# Patient Record
Sex: Male | Born: 1965 | Race: White | Hispanic: No | Marital: Married | Smoking: Never smoker
Health system: Southern US, Community
[De-identification: ages and names within clinical notes are randomized; demographics above are authoritative.]

## PROBLEM LIST (undated history)

## (undated) DIAGNOSIS — M069 Rheumatoid arthritis, unspecified: Secondary | ICD-10-CM

## (undated) DIAGNOSIS — I10 Essential (primary) hypertension: Secondary | ICD-10-CM

## (undated) HISTORY — DX: Essential (primary) hypertension: I10

## (undated) HISTORY — PX: NASAL SINUS SURGERY: SHX719

---

## 2004-08-20 ENCOUNTER — Ambulatory Visit: Payer: Self-pay | Admitting: Family Medicine

## 2009-09-26 ENCOUNTER — Ambulatory Visit: Payer: Self-pay | Admitting: Family Medicine

## 2011-12-02 ENCOUNTER — Ambulatory Visit: Payer: Self-pay | Admitting: Sports Medicine

## 2015-05-18 ENCOUNTER — Other Ambulatory Visit: Payer: Self-pay | Admitting: Family Medicine

## 2015-09-25 ENCOUNTER — Other Ambulatory Visit: Payer: Self-pay | Admitting: Family Medicine

## 2015-10-16 ENCOUNTER — Other Ambulatory Visit: Payer: Self-pay | Admitting: Family Medicine

## 2015-11-22 ENCOUNTER — Ambulatory Visit (INDEPENDENT_AMBULATORY_CARE_PROVIDER_SITE_OTHER): Payer: BLUE CROSS/BLUE SHIELD | Admitting: Family Medicine

## 2015-11-22 ENCOUNTER — Encounter: Payer: Self-pay | Admitting: Family Medicine

## 2015-11-22 VITALS — BP 138/90 | HR 62 | Ht 70.0 in | Wt 218.0 lb

## 2015-11-22 DIAGNOSIS — I10 Essential (primary) hypertension: Secondary | ICD-10-CM | POA: Diagnosis not present

## 2015-11-22 MED ORDER — LISINOPRIL-HYDROCHLOROTHIAZIDE 10-12.5 MG PO TABS: 1.0000 | ORAL_TABLET | Freq: Every day | ORAL | Status: DC

## 2015-11-22 NOTE — Progress Notes (Signed)
Name: Brendan Ray   MRN: [id number]    DOB: [birth year]   Date:11/22/2015       Progress Note  Subjective  Chief Complaint  Chief Complaint  Patient presents with  . Hypertension    Hypertension This is a chronic problem. The current episode started more than 1 year ago. The problem is unchanged. The problem is controlled. Pertinent negatives include no anxiety, blurred vision, chest pain, headaches, malaise/fatigue, neck pain, orthopnea, palpitations, peripheral edema, PND, shortness of breath or sweats. There are no associated agents to hypertension. Risk factors for coronary artery disease include dyslipidemia. Past treatments include ACE inhibitors and diuretics. The current treatment provides mild improvement. There are no compliance problems.  There is no history of angina, kidney disease, CAD/MI, CVA, heart failure, left ventricular hypertrophy, PVD, renovascular disease or retinopathy. There is no history of chronic renal disease or a hypertension causing med.    No problem-specific assessment & plan notes found for this encounter.   Past Medical History  Diagnosis Date  . Hypertension     Past Surgical History  Procedure Laterality Date  . Nasal sinus surgery      History reviewed. No pertinent family history.  Social History   Social History  . Marital Status: Married    Spouse Name: N/A  . Number of Children: N/A  . Years of Education: N/A   Occupational History  . Not on file.   Social History Main Topics  . Smoking status: Never Smoker   . Smokeless tobacco: Not on file  . Alcohol Use: No  . Drug Use: No  . Sexual Activity: Yes   Other Topics Concern  . Not on file   Social History Narrative  . No narrative on file    No Known Allergies   Review of Systems  Constitutional: Negative for fever, chills, weight loss and malaise/fatigue.  HENT: Negative for ear discharge, ear pain and sore throat.   Eyes: Negative for blurred vision.  Respiratory:  Negative for cough, sputum production, shortness of breath and wheezing.   Cardiovascular: Negative for chest pain, palpitations, orthopnea, leg swelling and PND.  Gastrointestinal: Negative for heartburn, nausea, abdominal pain, diarrhea, constipation, blood in stool and melena.  Genitourinary: Negative for dysuria, urgency, frequency and hematuria.  Musculoskeletal: Negative for myalgias, back pain, joint pain and neck pain.  Skin: Negative for rash.  Neurological: Negative for dizziness, tingling, sensory change, focal weakness and headaches.  Endo/Heme/Allergies: Negative for environmental allergies and polydipsia. Does not bruise/bleed easily.  Psychiatric/Behavioral: Negative for depression and suicidal ideas. The patient is not nervous/anxious and does not have insomnia.      Objective  Filed Vitals:   11/22/15 0947  BP: 138/90  Pulse: 62  Height:  (1.778 m)  Weight: 218 lb (98.884 kg)    Physical Exam  Constitutional: He is oriented to person, place, and time and well-developed, well-nourished, and in no distress.  HENT:  Head: Normocephalic.  Right Ear: External ear normal.  Left Ear: External ear normal.  Nose: Nose normal.  Mouth/Throat: Oropharynx is clear and moist.  Eyes: Conjunctivae and EOM are normal. Pupils are equal, round, and reactive to light. Right eye exhibits no discharge. Left eye exhibits no discharge. No scleral icterus.  Neck: Normal range of motion. Neck supple. No JVD present. No tracheal deviation present. No thyromegaly present.  Cardiovascular: Normal rate, regular rhythm, normal heart sounds and intact distal pulses.  Exam reveals no gallop and no friction rub.  No murmur heard. Pulmonary/Chest: Breath sounds normal. No respiratory distress. He has no wheezes. He has no rales.  Abdominal: Soft. Bowel sounds are normal. He exhibits no mass. There is no hepatosplenomegaly. There is no tenderness. There is no rebound, no guarding and no CVA  tenderness.  Musculoskeletal: Normal range of motion. He exhibits no edema or tenderness.  Lymphadenopathy:    He has no cervical adenopathy.  Neurological: He is alert and oriented to person, place, and time. He has normal sensation, normal strength, normal reflexes and intact cranial nerves. No cranial nerve deficit.  Skin: Skin is warm. No rash noted.  Psychiatric: Mood and affect normal.  Nursing note and vitals reviewed.     Assessment & Plan  Problem List Items Addressed This Visit      Cardiovascular and Mediastinum   Essential hypertension - Primary   Relevant Medications   lisinopril-hydrochlorothiazide (PRINZIDE,ZESTORETIC) 10-12.5 MG tablet   Other Relevant Orders   Renal Function Panel        Dr. Elizabeth Sauereanna Axtyn Woehler [facility]  11/22/2015

## 2015-11-23 LAB — RENAL FUNCTION PANEL
ALBUMIN: 4 g/dL (ref 3.5–5.5)
BUN/Creatinine Ratio: 9 (ref 9–20)
BUN: 10 mg/dL (ref 6–24)
CALCIUM: 9.2 mg/dL (ref 8.7–10.2)
CHLORIDE: 101 mmol/L (ref 96–106)
CO2: 22 mmol/L (ref 18–29)
Creatinine, Ser: 1.09 mg/dL (ref 0.76–1.27)
GFR calc Af Amer: 91 mL/min/{1.73_m2} (ref >59)
GFR, EST NON AFRICAN AMERICAN: 79 mL/min/{1.73_m2} (ref >59)
GLUCOSE: 91 mg/dL (ref 65–99)
PHOSPHORUS: 2.8 mg/dL (ref 2.5–4.5)
POTASSIUM: 4.3 mmol/L (ref 3.5–5.2)
Sodium: 140 mmol/L (ref 134–144)

## 2016-04-16 ENCOUNTER — Encounter: Payer: Self-pay | Admitting: Family Medicine

## 2016-04-16 ENCOUNTER — Ambulatory Visit
Admission: RE | Admit: 2016-04-16 | Discharge: 2016-04-16 | Disposition: A | Discharge status: Home / Self Care | Payer: BLUE CROSS/BLUE SHIELD | Source: Ambulatory Visit | Attending: Family Medicine | Admitting: Family Medicine

## 2016-04-16 ENCOUNTER — Ambulatory Visit (INDEPENDENT_AMBULATORY_CARE_PROVIDER_SITE_OTHER): Payer: BLUE CROSS/BLUE SHIELD | Admitting: Family Medicine

## 2016-04-16 VITALS — BP 120/80 | HR 68 | Ht 70.0 in | Wt 216.0 lb

## 2016-04-16 DIAGNOSIS — M519 Unspecified thoracic, thoracolumbar and lumbosacral intervertebral disc disorder: Secondary | ICD-10-CM

## 2016-04-16 DIAGNOSIS — M47814 Spondylosis without myelopathy or radiculopathy, thoracic region: Secondary | ICD-10-CM | POA: Diagnosis not present

## 2016-04-16 DIAGNOSIS — M47812 Spondylosis without myelopathy or radiculopathy, cervical region: Secondary | ICD-10-CM | POA: Insufficient documentation

## 2016-04-16 MED ORDER — MELOXICAM 15 MG PO TABS: 15.0000 mg | ORAL_TABLET | Freq: Every day | ORAL | 2 refills | Status: DC

## 2016-04-16 MED ORDER — PREDNISONE 10 MG PO TABS: 10.0000 mg | ORAL_TABLET | Freq: Every day | ORAL | 0 refills | Status: DC

## 2016-04-16 MED ORDER — TRAMADOL HCL 50 MG PO TABS: 50.0000 mg | ORAL_TABLET | Freq: Every evening | ORAL | 0 refills | Status: DC | PRN

## 2016-04-16 NOTE — Progress Notes (Signed)
Name: Brendan Ray   MRN: [id number]    DOB: 10-08-1965   Date:04/16/2016       Progress Note  Subjective  Chief Complaint  Chief Complaint  Patient presents with  . Back Pain    has had back issues x 3 years- when he twists or moves the wrong way it will flare up again    Back Pain  This is a recurrent problem. The current episode started more than 1 month ago. The problem occurs constantly. The problem has been waxing and waning since onset. The pain is present in the thoracic spine and lumbar spine. The quality of the pain is described as aching. The pain does not radiate. The pain is at a severity of 3/10. The pain is moderate. The symptoms are aggravated by twisting and sitting. Pertinent negatives include no abdominal pain, bladder incontinence, bowel incontinence, chest pain, dysuria, fever, headaches, leg pain, numbness, paresis, paresthesias, tingling, weakness or weight loss. He has tried NSAIDs for the symptoms. The treatment provided moderate relief.    No problem-specific Assessment & Plan notes found for this encounter.   Past Medical History:  Diagnosis Date  . Hypertension     Past Surgical History:  Procedure Laterality Date  . NASAL SINUS SURGERY      History reviewed. No pertinent family history.  Social History   Social History  . Marital status: Married    Spouse name: N/A  . Number of children: N/A  . Years of education: N/A   Occupational History  . Not on file.   Social History Main Topics  . Smoking status: Never Smoker  . Smokeless tobacco: Not on file  . Alcohol use No  . Drug use: No  . Sexual activity: Yes   Other Topics Concern  . Not on file   Social History Narrative  . No narrative on file    No Known Allergies   Review of Systems  Constitutional: Negative for chills, fever, malaise/fatigue and weight loss.  HENT: Negative for ear discharge, ear pain and sore throat.   Eyes: Negative for blurred vision.  Respiratory:  Negative for cough, sputum production, shortness of breath and wheezing.   Cardiovascular: Negative for chest pain, palpitations and leg swelling.  Gastrointestinal: Negative for abdominal pain, blood in stool, bowel incontinence, constipation, diarrhea, heartburn, melena and nausea.  Genitourinary: Negative for bladder incontinence, dysuria, frequency, hematuria and urgency.  Musculoskeletal: Positive for back pain. Negative for joint pain, myalgias and neck pain.  Skin: Negative for rash.  Neurological: Negative for dizziness, tingling, sensory change, focal weakness, weakness, numbness, headaches and paresthesias.  Endo/Heme/Allergies: Negative for environmental allergies and polydipsia. Does not bruise/bleed easily.  Psychiatric/Behavioral: Negative for depression and suicidal ideas. The patient is not nervous/anxious and does not have insomnia.      Objective  Vitals:   04/16/16 1113  BP: 120/80  Pulse: 68  Weight: 216 lb (98 kg)  Height: 5\' 10"  (1.778 m)    Physical Exam  Constitutional: He is oriented to person, place, and time and well-developed, well-nourished, and in no distress.  HENT:  Head: Normocephalic.  Right Ear: External ear normal.  Left Ear: External ear normal.  Nose: Nose normal.  Mouth/Throat: Oropharynx is clear and moist.  Eyes: Conjunctivae and EOM are normal. Pupils are equal, round, and reactive to light. Right eye exhibits no discharge. Left eye exhibits no discharge. No scleral icterus.  Neck: Normal range of motion. Neck supple. No JVD present. No tracheal deviation  present. No thyromegaly present.  Cardiovascular: Normal rate, regular rhythm, normal heart sounds and intact distal pulses.  Exam reveals no gallop and no friction rub.   No murmur heard. Pulmonary/Chest: Breath sounds normal. No respiratory distress. He has no wheezes. He has no rales.  Abdominal: Soft. Bowel sounds are normal. He exhibits no mass. There is no hepatosplenomegaly. There  is no tenderness. There is no rebound, no guarding and no CVA tenderness.  Musculoskeletal: Normal range of motion. He exhibits no edema.       Thoracic back: He exhibits tenderness. He exhibits no deformity.  Right 9th 10th costovertebral/rib  Lymphadenopathy:    He has no cervical adenopathy.  Neurological: He is alert and oriented to person, place, and time. He has normal sensation, normal strength, normal reflexes and intact cranial nerves. No cranial nerve deficit.  Skin: Skin is warm. No rash noted.  Psychiatric: Mood and affect normal.  Nursing note and vitals reviewed.     Assessment & Plan  Problem List Items Addressed This Visit    None    Visit Diagnoses    Thoracic disc disease    -  Primary   Relevant Medications   predniSONE (DELTASONE) 10 MG tablet   traMADol (ULTRAM) 50 MG tablet   meloxicam (MOBIC) 15 MG tablet   Other Relevant Orders   DG Thoracic Spine W/Swimmers        Dr. Hayden Rasmusseneanna Jones [facility]  04/16/16

## 2016-04-25 ENCOUNTER — Other Ambulatory Visit: Payer: Self-pay

## 2016-04-25 MED ORDER — CYCLOBENZAPRINE HCL 10 MG PO TABS: 10.0000 mg | ORAL_TABLET | Freq: Three times a day (TID) | ORAL | 0 refills | Status: DC | PRN

## 2016-05-22 ENCOUNTER — Other Ambulatory Visit: Payer: Self-pay | Admitting: Family Medicine

## 2016-05-22 DIAGNOSIS — I10 Essential (primary) hypertension: Secondary | ICD-10-CM

## 2016-10-01 ENCOUNTER — Other Ambulatory Visit: Payer: Self-pay | Admitting: Family Medicine

## 2016-10-01 ENCOUNTER — Ambulatory Visit
Admission: RE | Admit: 2016-10-01 | Discharge: 2016-10-01 | Disposition: A | Discharge status: Home / Self Care | Payer: BLUE CROSS/BLUE SHIELD | Source: Ambulatory Visit | Attending: Family Medicine | Admitting: Family Medicine

## 2016-10-01 DIAGNOSIS — R059 Cough, unspecified: Secondary | ICD-10-CM

## 2016-10-01 DIAGNOSIS — R509 Fever, unspecified: Secondary | ICD-10-CM

## 2016-10-01 DIAGNOSIS — R062 Wheezing: Secondary | ICD-10-CM | POA: Diagnosis not present

## 2016-10-01 DIAGNOSIS — R05 Cough: Secondary | ICD-10-CM

## 2016-10-01 DIAGNOSIS — R0602 Shortness of breath: Secondary | ICD-10-CM | POA: Diagnosis not present

## 2016-10-01 DIAGNOSIS — R079 Chest pain, unspecified: Secondary | ICD-10-CM | POA: Diagnosis not present

## 2016-10-20 ENCOUNTER — Other Ambulatory Visit: Payer: Self-pay | Admitting: Family Medicine

## 2016-10-20 DIAGNOSIS — I10 Essential (primary) hypertension: Secondary | ICD-10-CM

## 2016-11-17 ENCOUNTER — Other Ambulatory Visit: Payer: Self-pay

## 2017-01-28 ENCOUNTER — Ambulatory Visit: Payer: BLUE CROSS/BLUE SHIELD | Admitting: Family Medicine

## 2017-01-29 ENCOUNTER — Encounter: Payer: Self-pay | Admitting: Family Medicine

## 2017-01-29 ENCOUNTER — Ambulatory Visit (INDEPENDENT_AMBULATORY_CARE_PROVIDER_SITE_OTHER): Payer: BLUE CROSS/BLUE SHIELD | Admitting: Family Medicine

## 2017-01-29 VITALS — BP 140/80 | HR 84 | Ht 70.0 in | Wt 224.0 lb

## 2017-01-29 DIAGNOSIS — M545 Low back pain: Secondary | ICD-10-CM | POA: Diagnosis not present

## 2017-01-29 DIAGNOSIS — G8929 Other chronic pain: Secondary | ICD-10-CM | POA: Diagnosis not present

## 2017-01-29 DIAGNOSIS — I10 Essential (primary) hypertension: Secondary | ICD-10-CM

## 2017-01-29 DIAGNOSIS — Z1211 Encounter for screening for malignant neoplasm of colon: Secondary | ICD-10-CM

## 2017-01-29 DIAGNOSIS — M25561 Pain in right knee: Secondary | ICD-10-CM

## 2017-01-29 DIAGNOSIS — E663 Overweight: Secondary | ICD-10-CM

## 2017-01-29 DIAGNOSIS — M519 Unspecified thoracic, thoracolumbar and lumbosacral intervertebral disc disorder: Secondary | ICD-10-CM | POA: Diagnosis not present

## 2017-01-29 DIAGNOSIS — M25562 Pain in left knee: Secondary | ICD-10-CM

## 2017-01-29 DIAGNOSIS — Z23 Encounter for immunization: Secondary | ICD-10-CM

## 2017-01-29 LAB — POCT URINALYSIS DIPSTICK
Bilirubin, UA: NEGATIVE
Blood, UA: NEGATIVE
Glucose, UA: NEGATIVE
KETONES UA: NEGATIVE
LEUKOCYTES UA: NEGATIVE
NITRITE UA: NEGATIVE
PROTEIN UA: NEGATIVE
Spec Grav, UA: 1.01 (ref 1.010–1.025)
Urobilinogen, UA: 0.2 E.U./dL
pH, UA: 7 (ref 5.0–8.0)

## 2017-01-29 LAB — HEMOCCULT GUIAC POC 1CARD (OFFICE): Fecal Occult Blood, POC: NEGATIVE

## 2017-01-29 MED ORDER — LISINOPRIL-HYDROCHLOROTHIAZIDE 10-12.5 MG PO TABS: 1.0000 | ORAL_TABLET | Freq: Every day | ORAL | 3 refills | Status: DC

## 2017-01-29 MED ORDER — CYCLOBENZAPRINE HCL 10 MG PO TABS: 10.0000 mg | ORAL_TABLET | Freq: Three times a day (TID) | ORAL | 6 refills | Status: DC | PRN

## 2017-01-29 MED ORDER — MELOXICAM 15 MG PO TABS: 15.0000 mg | ORAL_TABLET | Freq: Every day | ORAL | 6 refills | Status: DC

## 2017-01-29 NOTE — Progress Notes (Signed)
Name: Brendan Ray   MRN: [id number]    DOB: Oct 07, 1965   Date:01/29/2017       Progress Note  Subjective  Chief Complaint  Chief Complaint  Patient presents with  . Hypertension  . Back Pain    refill flexeril and meloxicam    Hypertension  This is a chronic problem. The current episode started more than 1 year ago. The problem is unchanged. The problem is controlled. Pertinent negatives include no anxiety, blurred vision, chest pain, headaches, malaise/fatigue, neck pain, orthopnea, palpitations, peripheral edema, PND, shortness of breath or sweats. There are no associated agents to hypertension. There are no known risk factors for coronary artery disease. Past treatments include ACE inhibitors and diuretics. The current treatment provides moderate improvement. There are no compliance problems.  There is no history of angina, kidney disease, CAD/MI, CVA, heart failure, left ventricular hypertrophy, PVD or retinopathy. There is no history of chronic renal disease, a hypertension causing med or renovascular disease.  Back Pain  This is a chronic problem. The current episode started more than 1 year ago. The problem occurs intermittently. The problem has been waxing and waning since onset. The pain is present in the lumbar spine. The pain is at a severity of 4/10. The symptoms are aggravated by twisting. Pertinent negatives include no abdominal pain, bladder incontinence, bowel incontinence, chest pain, dysuria, fever, headaches, numbness, tingling, weakness or weight loss.  Knee Pain   The incident occurred more than 1 week ago. There was no injury mechanism. The pain is mild. Pertinent negatives include no numbness or tingling.    No problem-specific Assessment & Plan notes found for this encounter.   Past Medical History:  Diagnosis Date  . Hypertension     Past Surgical History:  Procedure Laterality Date  . NASAL SINUS SURGERY      No family history on file.  Social History    Social History  . Marital status: Married    Spouse name: N/A  . Number of children: N/A  . Years of education: N/A   Occupational History  . Not on file.   Social History Main Topics  . Smoking status: Never Smoker  . Smokeless tobacco: Never Used  . Alcohol use No  . Drug use: No  . Sexual activity: Yes   Other Topics Concern  . Not on file   Social History Narrative  . No narrative on file    No Known Allergies  Outpatient Medications Prior to Visit  Medication Sig Dispense Refill  . cyclobenzaprine (FLEXERIL) 10 MG tablet Take 1 tablet (10 mg total) by mouth 3 (three) times daily as needed for muscle spasms. 30 tablet 0  . lisinopril-hydrochlorothiazide (PRINZIDE,ZESTORETIC) 10-12.5 MG tablet TAKE 1 TABLET BY MOUTH DAILY. 30 tablet 0  . meloxicam (MOBIC) 15 MG tablet Take 1 tablet (15 mg total) by mouth daily. (Patient not taking: Reported on 01/29/2017) 30 tablet 2  . predniSONE (DELTASONE) 10 MG tablet Take 1 tablet (10 mg total) by mouth daily with breakfast. 14 tablet 0  . traMADol (ULTRAM) 50 MG tablet Take 1 tablet (50 mg total) by mouth at bedtime as needed. 30 tablet 0   No facility-administered medications prior to visit.     Review of Systems  Constitutional: Negative for chills, fever, malaise/fatigue and weight loss.  HENT: Negative for ear discharge, ear pain and sore throat.   Eyes: Negative for blurred vision.  Respiratory: Negative for cough, sputum production, shortness of breath and wheezing.  Cardiovascular: Negative for chest pain, palpitations, orthopnea, leg swelling and PND.  Gastrointestinal: Negative for abdominal pain, blood in stool, bowel incontinence, constipation, diarrhea, heartburn, melena and nausea.  Genitourinary: Negative for bladder incontinence, dysuria, frequency, hematuria and urgency.  Musculoskeletal: Positive for back pain and joint pain. Negative for myalgias and neck pain.  Skin: Negative for rash.  Neurological:  Negative for dizziness, tingling, sensory change, focal weakness, weakness, numbness and headaches.  Endo/Heme/Allergies: Negative for environmental allergies and polydipsia. Does not bruise/bleed easily.  Psychiatric/Behavioral: Negative for depression and suicidal ideas. The patient is not nervous/anxious and does not have insomnia.      Objective  Vitals:   01/29/17 0928  BP: 140/80  Pulse: 84  Weight: 224 lb (101.6 kg)  Height: 5\' 10"  (1.778 m)    Physical Exam  Constitutional: He is oriented to person, place, and time and well-developed, well-nourished, and in no distress.  HENT:  Head: Normocephalic.  Right Ear: External ear normal.  Left Ear: External ear normal.  Nose: Nose normal.  Mouth/Throat: Oropharynx is clear and moist.  Eyes: Pupils are equal, round, and reactive to light. Conjunctivae and EOM are normal. Right eye exhibits no discharge. Left eye exhibits no discharge. No scleral icterus.  Neck: Normal range of motion. Neck supple. No JVD present. No tracheal deviation present. No thyromegaly present.  Cardiovascular: Normal rate, regular rhythm, normal heart sounds and intact distal pulses.  Exam reveals no gallop and no friction rub.   No murmur heard. Pulmonary/Chest: Breath sounds normal. No respiratory distress. He has no wheezes. He has no rales.  Abdominal: Soft. Bowel sounds are normal. He exhibits no mass. There is no hepatosplenomegaly. There is no tenderness. There is no rebound, no guarding and no CVA tenderness.  Genitourinary: Rectum normal, prostate normal and testes/scrotum normal. Rectal exam shows guaiac negative stool.  Musculoskeletal: Normal range of motion. He exhibits no edema or tenderness.  Lymphadenopathy:    He has no cervical adenopathy.  Neurological: He is alert and oriented to person, place, and time. He has normal sensation, normal strength, normal reflexes and intact cranial nerves. No cranial nerve deficit.  Skin: Skin is warm. No  rash noted.  Psychiatric: Mood and affect normal.  Nursing note and vitals reviewed.     Assessment & Plan  Problem List Items Addressed This Visit      Cardiovascular and Mediastinum   Essential hypertension - Primary   Relevant Medications   lisinopril-hydrochlorothiazide (PRINZIDE,ZESTORETIC) 10-12.5 MG tablet   Other Relevant Orders   Renal Function Panel    Other Visit Diagnoses    Chronic bilateral low back pain without sciatica       Relevant Medications   meloxicam (MOBIC) 15 MG tablet   cyclobenzaprine (FLEXERIL) 10 MG tablet   Arthralgia of both knees       Thoracic disc disease       Relevant Medications   meloxicam (MOBIC) 15 MG tablet   Colon cancer screening       Relevant Orders   Ambulatory referral to Gastroenterology   POCT occult blood stool (Completed)   Need for diphtheria-tetanus-pertussis (Tdap) vaccine       Overweight       Relevant Orders   Lipid Profile   POCT urinalysis dipstick (Completed)      Meds ordered this encounter  Medications  . meloxicam (MOBIC) 15 MG tablet    Sig: Take 1 tablet (15 mg total) by mouth daily.    Dispense:  30 tablet  Refill:  6  . lisinopril-hydrochlorothiazide (PRINZIDE,ZESTORETIC) 10-12.5 MG tablet    Sig: Take 1 tablet by mouth daily.    Dispense:  90 tablet    Refill:  3    Needs appt  . cyclobenzaprine (FLEXERIL) 10 MG tablet    Sig: Take 1 tablet (10 mg total) by mouth 3 (three) times daily as needed for muscle spasms.    Dispense:  30 tablet    Refill:  6      Dr. Elizabeth Sauer [facility]  01/29/17

## 2017-01-30 LAB — LIPID PANEL
CHOLESTEROL TOTAL: 173 mg/dL (ref 100–199)
Chol/HDL Ratio: 4.6 ratio (ref 0.0–5.0)
HDL: 38 mg/dL — ABNORMAL LOW (ref >39)
LDL CALC: 114 mg/dL — AB (ref 0–99)
Triglycerides: 105 mg/dL (ref 0–149)
VLDL Cholesterol Cal: 21 mg/dL (ref 5–40)

## 2017-01-30 LAB — RENAL FUNCTION PANEL
Albumin: 4.1 g/dL (ref 3.5–5.5)
BUN / CREAT RATIO: 9 (ref 9–20)
BUN: 10 mg/dL (ref 6–24)
CO2: 20 mmol/L (ref 20–29)
CREATININE: 1.11 mg/dL (ref 0.76–1.27)
Calcium: 8.8 mg/dL (ref 8.7–10.2)
Chloride: 104 mmol/L (ref 96–106)
GFR calc Af Amer: 88 mL/min/{1.73_m2} (ref >59)
GFR calc non Af Amer: 76 mL/min/{1.73_m2} (ref >59)
GLUCOSE: 84 mg/dL (ref 65–99)
POTASSIUM: 4 mmol/L (ref 3.5–5.2)
Phosphorus: 2.2 mg/dL — ABNORMAL LOW (ref 2.5–4.5)
SODIUM: 141 mmol/L (ref 134–144)

## 2017-02-13 ENCOUNTER — Telehealth: Payer: Self-pay

## 2017-03-06 NOTE — Telephone Encounter (Signed)
ERROR

## 2017-04-10 ENCOUNTER — Ambulatory Visit
Admission: RE | Admit: 2017-04-10 | Discharge: 2017-04-10 | Disposition: A | Discharge status: Home / Self Care | Payer: BLUE CROSS/BLUE SHIELD | Source: Ambulatory Visit | Attending: Family Medicine | Admitting: Family Medicine

## 2017-04-10 ENCOUNTER — Ambulatory Visit (INDEPENDENT_AMBULATORY_CARE_PROVIDER_SITE_OTHER): Payer: BLUE CROSS/BLUE SHIELD | Admitting: Family Medicine

## 2017-04-10 ENCOUNTER — Other Ambulatory Visit: Payer: Self-pay | Admitting: Family Medicine

## 2017-04-10 ENCOUNTER — Encounter: Payer: Self-pay | Admitting: Family Medicine

## 2017-04-10 VITALS — BP 130/80 | HR 80 | Ht 70.0 in | Wt 225.0 lb

## 2017-04-10 DIAGNOSIS — M546 Pain in thoracic spine: Secondary | ICD-10-CM

## 2017-04-10 DIAGNOSIS — M519 Unspecified thoracic, thoracolumbar and lumbosacral intervertebral disc disorder: Secondary | ICD-10-CM

## 2017-04-10 DIAGNOSIS — M5134 Other intervertebral disc degeneration, thoracic region: Secondary | ICD-10-CM | POA: Diagnosis not present

## 2017-04-10 DIAGNOSIS — M545 Low back pain, unspecified: Secondary | ICD-10-CM

## 2017-04-10 DIAGNOSIS — M47814 Spondylosis without myelopathy or radiculopathy, thoracic region: Secondary | ICD-10-CM | POA: Diagnosis not present

## 2017-04-10 DIAGNOSIS — R0789 Other chest pain: Secondary | ICD-10-CM | POA: Diagnosis not present

## 2017-04-10 DIAGNOSIS — G8929 Other chronic pain: Secondary | ICD-10-CM

## 2017-04-10 DIAGNOSIS — M5136 Other intervertebral disc degeneration, lumbar region: Secondary | ICD-10-CM | POA: Diagnosis not present

## 2017-04-10 DIAGNOSIS — R0781 Pleurodynia: Secondary | ICD-10-CM | POA: Diagnosis not present

## 2017-04-10 MED ORDER — CYCLOBENZAPRINE HCL 10 MG PO TABS: 10.0000 mg | ORAL_TABLET | Freq: Three times a day (TID) | ORAL | 6 refills | Status: DC | PRN

## 2017-04-10 MED ORDER — MELOXICAM 15 MG PO TABS: 15.0000 mg | ORAL_TABLET | Freq: Every day | ORAL | 6 refills | Status: DC
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
## 2017-04-13 ENCOUNTER — Other Ambulatory Visit: Payer: Self-pay

## 2017-04-13 DIAGNOSIS — M545 Low back pain, unspecified: Secondary | ICD-10-CM

## 2018-02-10 ENCOUNTER — Other Ambulatory Visit: Payer: Self-pay | Admitting: Family Medicine

## 2018-02-10 DIAGNOSIS — I10 Essential (primary) hypertension: Secondary | ICD-10-CM

## 2018-02-23 ENCOUNTER — Other Ambulatory Visit: Payer: Self-pay | Admitting: Family Medicine

## 2018-02-23 DIAGNOSIS — I10 Essential (primary) hypertension: Secondary | ICD-10-CM

## 2018-06-09 ENCOUNTER — Telehealth: Payer: Self-pay

## 2018-06-09 NOTE — Telephone Encounter (Signed)
Pt called in stating that he has lost 11 pounds on a diet and is having B/P readings ranging from 90s/50s- 118/80. Wanted to know could he stop or decrease b/p med. Pt was told he could stop the b/p med, but we needed to check his b/p in 4 weeks to make sure it has not increased again. Appt made for 07/06/18 @ 10:20- pt agreed

## 2018-07-06 ENCOUNTER — Encounter: Payer: Self-pay | Admitting: Family Medicine

## 2018-07-06 ENCOUNTER — Ambulatory Visit (INDEPENDENT_AMBULATORY_CARE_PROVIDER_SITE_OTHER): Payer: BLUE CROSS/BLUE SHIELD | Admitting: Family Medicine

## 2018-07-06 VITALS — BP 120/70 | HR 78 | Ht 70.0 in | Wt 208.0 lb

## 2018-07-06 DIAGNOSIS — E663 Overweight: Secondary | ICD-10-CM | POA: Diagnosis not present

## 2018-07-06 DIAGNOSIS — Z23 Encounter for immunization: Secondary | ICD-10-CM

## 2018-07-06 NOTE — Progress Notes (Signed)
Date:  07/06/2018   Name:  Brendan Ray   DOB:  Oct 20, 1965   MRN:  [id number]   Chief Complaint: Hypertension (stopped B/P med due to weight loss- recheck b/p) and tdap  Hypertension  This is a new problem. The current episode started more than 1 year ago. The problem is unchanged. The problem is controlled. Pertinent negatives include no anxiety, blurred vision, chest pain, headaches, malaise/fatigue, neck pain, orthopnea, palpitations, peripheral edema, PND, shortness of breath or sweats. There are no associated agents to hypertension. There are no known risk factors for coronary artery disease. Past treatments include lifestyle changes. The current treatment provides mild improvement. There are no compliance problems.  There is no history of angina, kidney disease, CAD/MI, CVA, heart failure, left ventricular hypertrophy, PVD or retinopathy. There is no history of chronic renal disease, a hypertension causing med or renovascular disease.    Review of Systems  Constitutional: Negative for chills, fever and malaise/fatigue.  HENT: Negative for drooling, ear discharge, ear pain and sore throat.   Eyes: Negative for blurred vision.  Respiratory: Negative for cough, shortness of breath and wheezing.   Cardiovascular: Negative for chest pain, palpitations, orthopnea, leg swelling and PND.  Gastrointestinal: Negative for abdominal pain, blood in stool, constipation, diarrhea and nausea.  Endocrine: Negative for polydipsia.  Genitourinary: Negative for dysuria, frequency, hematuria and urgency.  Musculoskeletal: Negative for back pain, myalgias and neck pain.  Skin: Negative for rash.  Allergic/Immunologic: Negative for environmental allergies.  Neurological: Negative for dizziness and headaches.  Hematological: Does not bruise/bleed easily.  Psychiatric/Behavioral: Negative for suicidal ideas. The patient is not nervous/anxious.     Patient Active Problem List   Diagnosis Date Noted  .  Essential hypertension 11/22/2015    No Known Allergies  Past Surgical History:  Procedure Laterality Date  . NASAL SINUS SURGERY      Social History   Tobacco Use  . Smoking status: Never Smoker  . Smokeless tobacco: Never Used  Substance Use Topics  . Alcohol use: No    Alcohol/week: 0.0 standard drinks  . Drug use: No     Medication list has been reviewed and updated.  Current Meds  Medication Sig  . [DISCONTINUED] cyclobenzaprine (FLEXERIL) 10 MG tablet Take 1 tablet (10 mg total) by mouth 3 (three) times daily as needed for muscle spasms.    PHQ 2/9 Scores 07/06/2018 11/22/2015  PHQ - 2 Score 0 0  PHQ- 9 Score 0 -    Physical Exam Vitals signs and nursing note reviewed.  Constitutional:      Appearance: He is well-developed.  HENT:     Head: Normocephalic.     Right Ear: External ear normal.     Left Ear: External ear normal.     Nose: Nose normal.  Eyes:     Conjunctiva/sclera: Conjunctivae normal.     Pupils: Pupils are equal, round, and reactive to light.  Neck:     Musculoskeletal: Normal range of motion and neck supple.  Cardiovascular:     Rate and Rhythm: Normal rate and regular rhythm.     Heart sounds: Normal heart sounds. No murmur. No gallop.   Pulmonary:     Effort: Pulmonary effort is normal.     Breath sounds: Normal breath sounds.  Abdominal:     General: Bowel sounds are normal.     Palpations: Abdomen is soft.  Genitourinary:    Penis: Normal.      Prostate: Normal.  Rectum: Normal.  Musculoskeletal: Normal range of motion.  Skin:    General: Skin is warm and dry.  Neurological:     Mental Status: He is alert and oriented to person, place, and time.     Deep Tendon Reflexes: Reflexes are normal and symmetric.  Psychiatric:        Behavior: Behavior normal.        Thought Content: Thought content normal.        Judgment: Judgment normal.     BP 120/70   Pulse 78   Ht 5\' 10"  (1.778 m)   Wt 208 lb (94.3 kg)   BMI 29.84  kg/m   Assessment and Plan: 1. Overweight (BMI 25.0-29.9) Since weight loss by design diet patient has lost over 20 pounds.  Pressure medication was discontinued later this month patient returns for blood pressure reading.  Pressure doing well today.  It was further encouraged to lose 10 pounds to the 190 range.  2. Need for Tdap vaccination Discussed and administered. - Tdap vaccine greater than or equal to 7yo IM

## 2018-07-16 ENCOUNTER — Encounter: Payer: Self-pay | Admitting: Emergency Medicine

## 2018-07-16 ENCOUNTER — Other Ambulatory Visit: Payer: Self-pay

## 2018-07-16 ENCOUNTER — Ambulatory Visit
Admission: EM | Admit: 2018-07-16 | Discharge: 2018-07-16 | Disposition: A | Discharge status: Home / Self Care | Payer: BLUE CROSS/BLUE SHIELD | Attending: Family Medicine | Admitting: Family Medicine

## 2018-07-16 DIAGNOSIS — J0100 Acute maxillary sinusitis, unspecified: Secondary | ICD-10-CM | POA: Diagnosis not present

## 2018-07-16 MED ORDER — METHYLPREDNISOLONE 4 MG PO TBPK: ORAL_TABLET | ORAL | 0 refills | Status: DC

## 2018-07-16 MED ORDER — PSEUDOEPHEDRINE HCL ER 120 MG PO TB12: 120.0000 mg | ORAL_TABLET | Freq: Two times a day (BID) | ORAL | 0 refills | Status: AC

## 2018-07-16 MED ORDER — AMOXICILLIN-POT CLAVULANATE 875-125 MG PO TABS: 1.0000 | ORAL_TABLET | Freq: Two times a day (BID) | ORAL | 0 refills | Status: DC

## 2018-07-16 NOTE — ED Triage Notes (Signed)
Patient c/o nasal congestion and pressure that started 3 weeks. Patient has taken OTC Sudafed for his symptoms.

## 2018-07-16 NOTE — ED Provider Notes (Signed)
[facility]    CSN: [id number] Arrival date & time: 07/16/18  1011     History   Chief Complaint Chief Complaint  Patient presents with  . Nasal Congestion  . Facial Pain    HPI Roselyn Meiererry R Steely is a 52 y.o. male.   Subjective:   Roselyn Meiererry R Goonan is a 52 y.o. male who presents for evaluation of possible sinus infection. Symptoms include bilateral ear pressure, nasal congestion and sinus pressure with no fever, chills, night sweats or weight loss. He denies any headache, sore throat, cough, or shortness of breath. Onset of symptoms was 3 weeks ago has been gradually worsening over the past week.  He has been taking Sudafed without any relief in his symptoms. He is drinking plenty of fluids.  Past history is significant for no history of pneumonia or bronchitis. Patient is a non-smoker.  The following portions of the patient's history were reviewed and updated as appropriate: allergies, current medications, past family history, past medical history, past social history, past surgical history and problem list.       Past Medical History:  Diagnosis Date  . Hypertension     There are no active problems to display for this patient.   Past Surgical History:  Procedure Laterality Date  . NASAL SINUS SURGERY         Home Medications    Prior to Admission medications   Medication Sig Start Date End Date Taking? Authorizing Provider  amoxicillin-clavulanate (AUGMENTIN) 875-125 MG tablet Take 1 tablet by mouth every 12 (twelve) hours. 07/16/18   Lurline IdolMurrill, Zebulin Siegel, FNP  methylPREDNISolone (MEDROL DOSEPAK) 4 MG TBPK tablet Take as directed 07/16/18   Lurline IdolMurrill, Krysia Zahradnik, FNP  pseudoephedrine (SUDAFED 12 HOUR) 120 MG 12 hr tablet Take 1 tablet (120 mg total) by mouth 2 (two) times daily for 7 days. 07/16/18 07/23/18  Lurline IdolMurrill, Carles Florea, FNP    Family History History reviewed. No pertinent family history.  Social History Social History   Tobacco Use  . Smoking  status: Never Smoker  . Smokeless tobacco: Never Used  Substance Use Topics  . Alcohol use: No    Alcohol/week: 0.0 standard drinks  . Drug use: No     Allergies   Patient has no known allergies.   Review of Systems Review of Systems  Constitutional: Negative for fever.  HENT: Positive for congestion, rhinorrhea, sinus pressure and sinus pain. Negative for sore throat.   Eyes: Negative.   Respiratory: Negative.   Cardiovascular: Negative.   Gastrointestinal: Negative.   Neurological: Negative.   All other systems reviewed and are negative.    Physical Exam Triage Vital Signs ED Triage Vitals  Enc Vitals Group     BP 07/16/18 1025 (!) 145/88     Pulse Rate 07/16/18 1025 63     Resp 07/16/18 1025 18     Temp 07/16/18 1025 97.9 F (36.6 C)     Temp Source 07/16/18 1025 Oral     SpO2 07/16/18 1025 99 %     Weight 07/16/18 1027 204 lb (92.5 kg)     Height 07/16/18 1027 5\' 10"  (1.778 m)     Head Circumference --      Peak Flow --      Pain Score 07/16/18 1027 0     Pain Loc --      Pain Edu? --      Excl. in [place]? --    No data found.  Updated Vital Signs BP (!) 145/88 (BP  Location: Right Arm)   Pulse 63   Temp 97.9 F (36.6 C) (Oral)   Resp 18   Ht 5\' 10"  (1.778 m)   Wt 204 lb (92.5 kg)   SpO2 99%   BMI 29.27 kg/m   Visual Acuity Right Eye Distance:   Left Eye Distance:   Bilateral Distance:    Right Eye Near:   Left Eye Near:    Bilateral Near:     Physical Exam Constitutional:      General: He is not in acute distress.    Appearance: Normal appearance. He is not ill-appearing.  HENT:     Head: Normocephalic.     Right Ear: Tympanic membrane, ear canal and external ear normal.     Left Ear: Tympanic membrane, ear canal and external ear normal.     Nose: Congestion present.     Right Sinus: Maxillary sinus tenderness present.     Left Sinus: Maxillary sinus tenderness present.     Mouth/Throat:     Mouth: Mucous membranes are moist.      Pharynx: Oropharynx is clear.  Eyes:     Extraocular Movements: Extraocular movements intact.     Conjunctiva/sclera: Conjunctivae normal.     Pupils: Pupils are equal, round, and reactive to light.  Neck:     Musculoskeletal: Normal range of motion and neck supple.  Cardiovascular:     Rate and Rhythm: Normal rate and regular rhythm.  Pulmonary:     Effort: Pulmonary effort is normal.     Breath sounds: Normal breath sounds.  Musculoskeletal: Normal range of motion.  Lymphadenopathy:     Cervical: No cervical adenopathy.  Skin:    General: Skin is warm and dry.  Neurological:     General: No focal deficit present.     Mental Status: He is alert and oriented to person, place, and time.  Psychiatric:        Mood and Affect: Mood normal.        Behavior: Behavior normal.      UC Treatments / Results  Labs (all labs ordered are listed, but only abnormal results are displayed) Labs Reviewed - No data to display  EKG None  Radiology No results found.  Procedures Procedures (including critical care time)  Medications Ordered in UC Medications - No data to display  Initial Impression / Assessment and Plan / UC Course  I have reviewed the triage vital signs and the nursing notes.  Pertinent labs & imaging results that were available during my care of the patient were reviewed by me and considered in my medical decision making (see chart for details).     52 year old male with a three-week history of worsening bilateral ear pressure, nasal congestion and sinus pressure. VSS. Afebrile. Nontoxic appearing. Symptoms suggestive of acute maxillary sinusitis. Supportive care advised as below.    Plan:  1. Sudafed 2. Augmentin 3. Medrol dose pack as directed  4. Nasacort daily  5. Tylenol/ibuprofen as needed  6. Drink plenty of fluids  7. Follow-up with PCP if symptoms worsen or persist.   Today's evaluation has revealed no signs of a dangerous process. Discussed  diagnosis with patient. Patient aware of their diagnosis, possible red flag symptoms to watch out for and need for close follow up. Patient understands verbal and written discharge instructions. Patient comfortable with plan and disposition.  Patient has a clear mental status at this time, good insight into illness (after discussion and teaching) and has clear  judgment to make decisions regarding their care.  Documentation was completed with the aid of voice recognition software. Transcription may contain typographical errors. Final Clinical Impressions(s) / UC Diagnoses   Final diagnoses:  Acute non-recurrent maxillary sinusitis     Discharge Instructions     Take medications as prescribed. Also start Nasacort 2 sprays in each nostril once daily. This medication is available over-the-counter. Tylenol or ibuprofen as needed. Drink plenty of fluids.     ED Prescriptions    Medication Sig Dispense Auth. Provider   pseudoephedrine (SUDAFED 12 HOUR) 120 MG 12 hr tablet Take 1 tablet (120 mg total) by mouth 2 (two) times daily for 7 days. 14 tablet Lurline Idol, FNP   amoxicillin-clavulanate (AUGMENTIN) 875-125 MG tablet Take 1 tablet by mouth every 12 (twelve) hours. 14 tablet Lurline Idol, FNP   methylPREDNISolone (MEDROL DOSEPAK) 4 MG TBPK tablet Take as directed 21 tablet Lurline Idol, FNP     Controlled Substance Prescriptions [place] Controlled Substance Registry consulted? Not Applicable   Lurline Idol, FNP 07/16/18 1116

## 2018-07-16 NOTE — Discharge Instructions (Addendum)
Take medications as prescribed. Also start Nasacort 2 sprays in each nostril once daily. This medication is available over-the-counter. Tylenol or ibuprofen as needed. Drink plenty of fluids.

## 2018-08-20 IMAGING — CR DG LUMBAR SPINE COMPLETE 4+V
5 series · 5 of 5 positions shown · non-contrast
Comparison: 09/26/2009

CLINICAL DATA: Back pain

EXAM:
LUMBAR SPINE - COMPLETE 4+ VIEW

[l-spine ap]
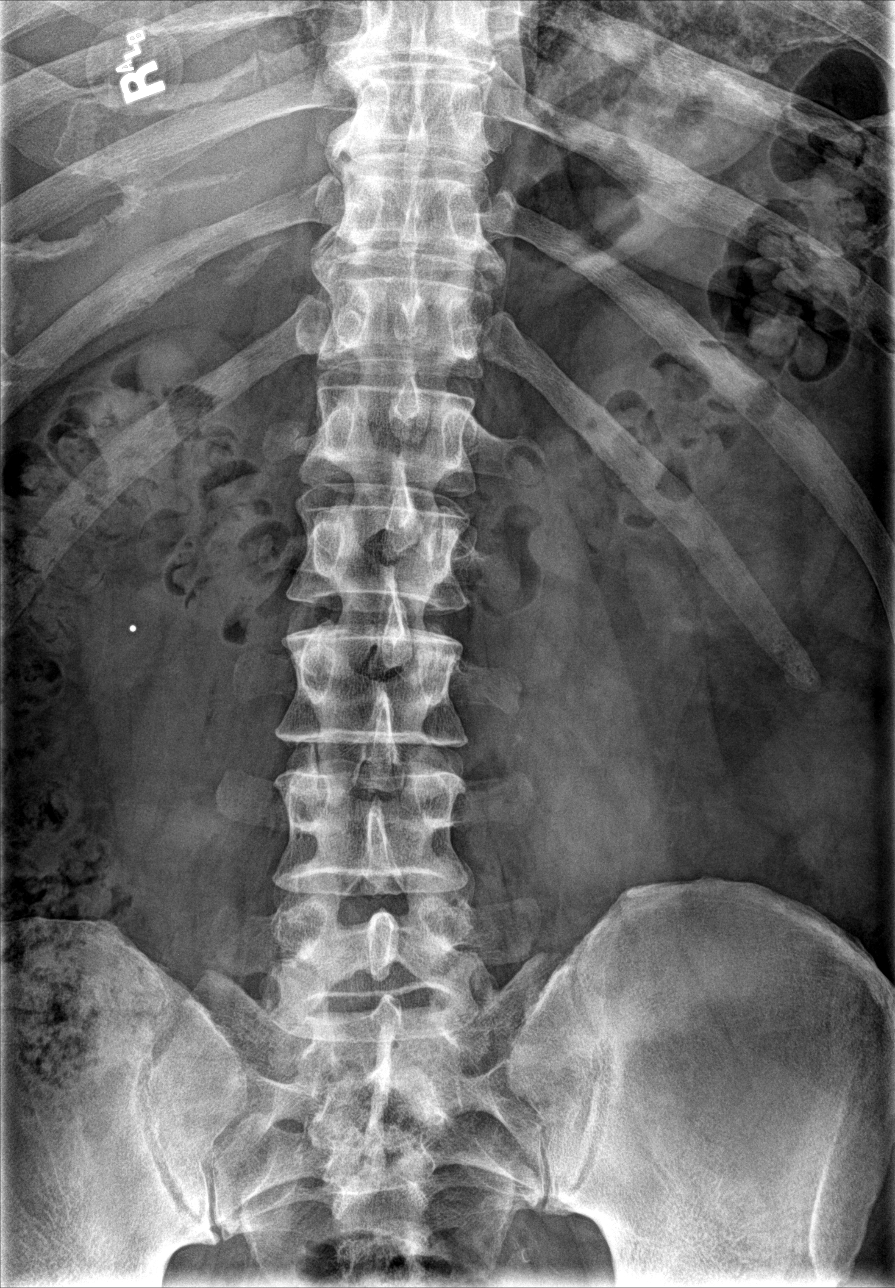

[l-spine obl (1 of 2)]
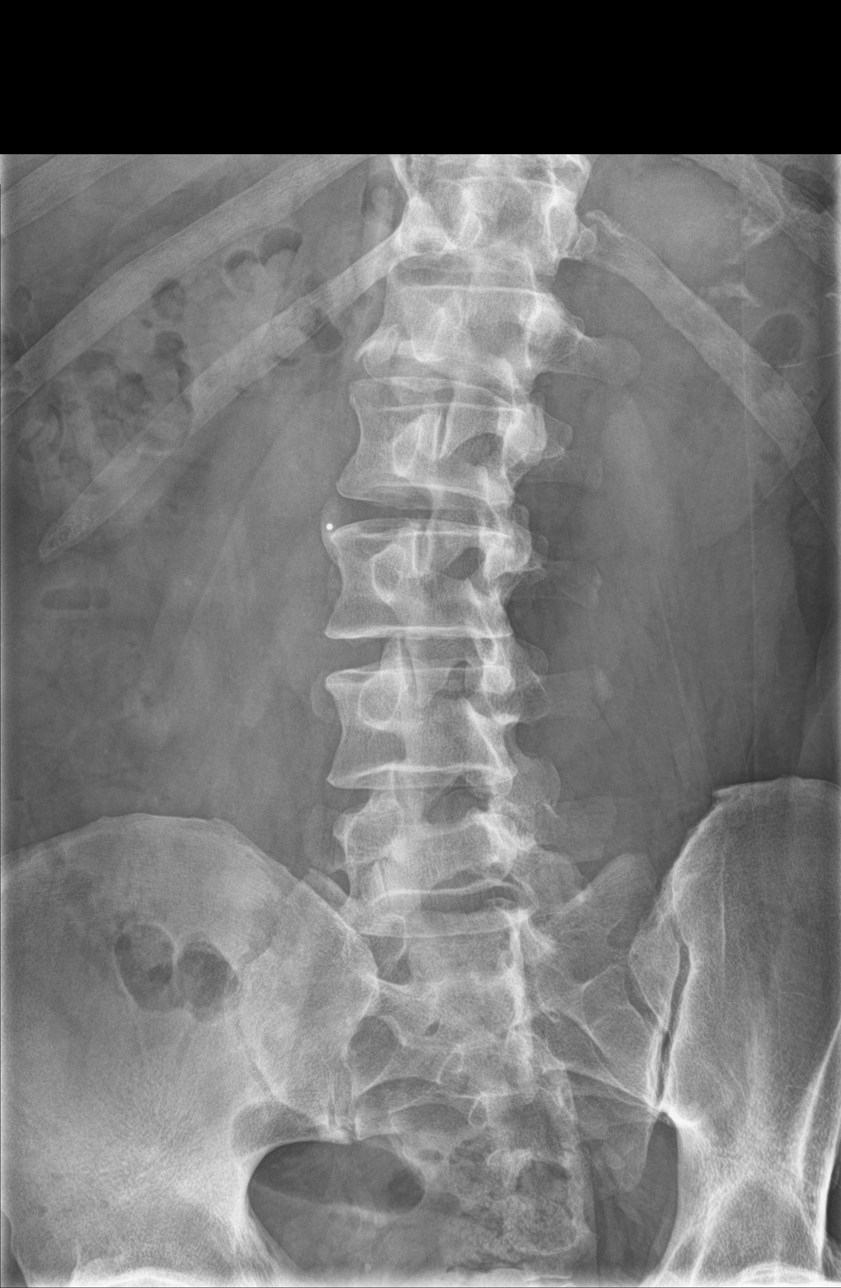

[l-spine obl (2 of 2)]
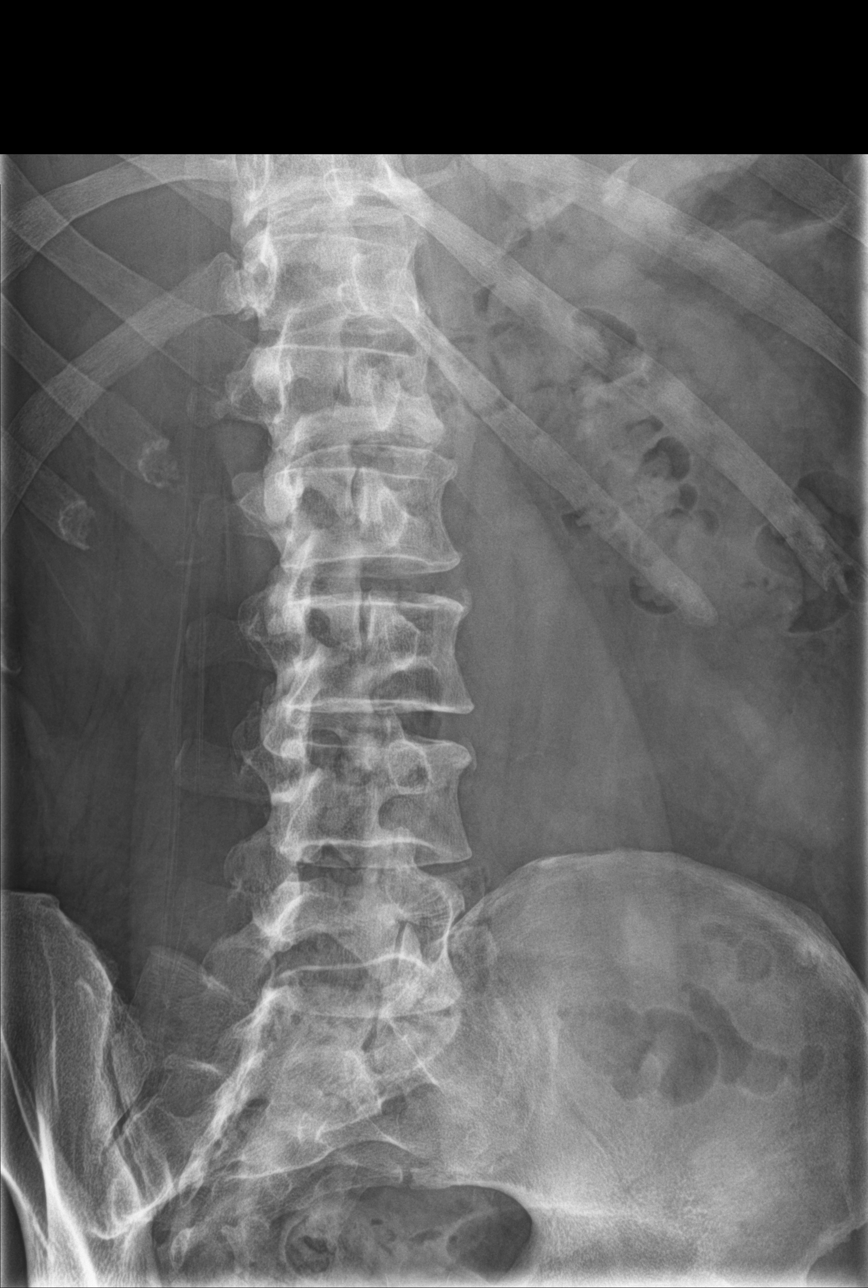

[l-spine lat]
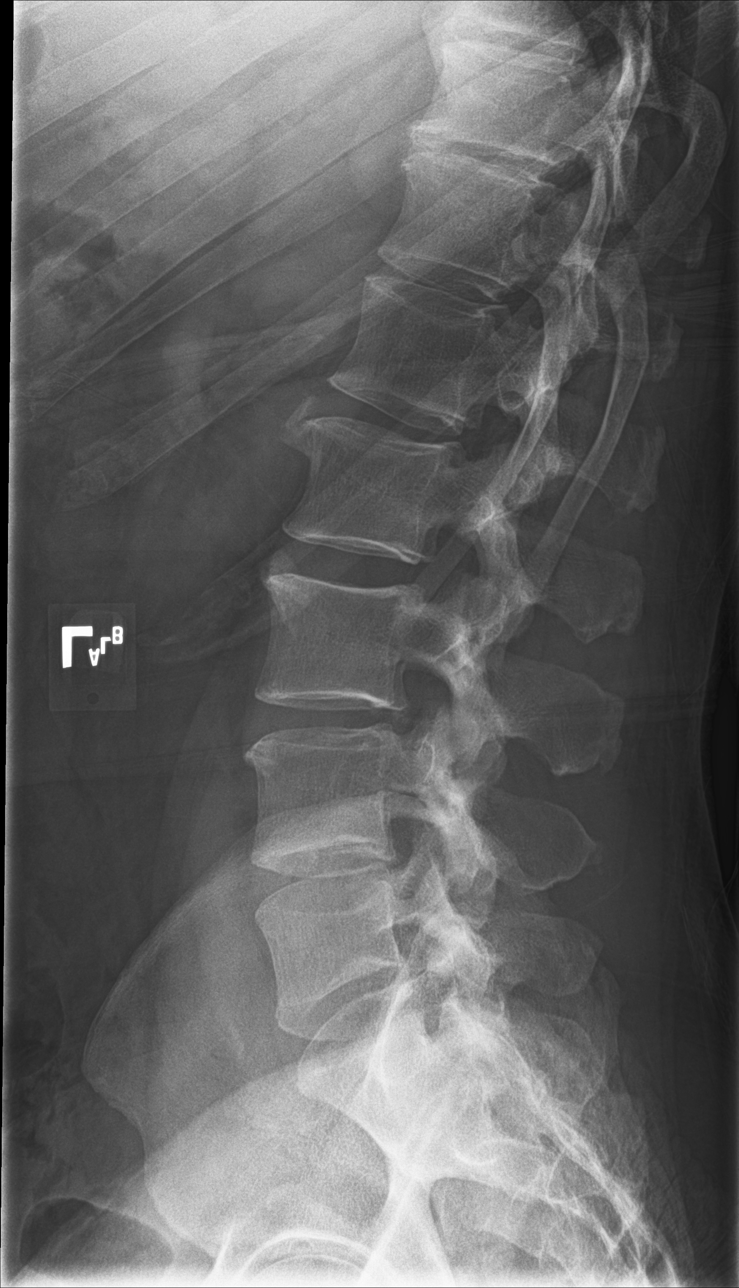

[l-spine spot]
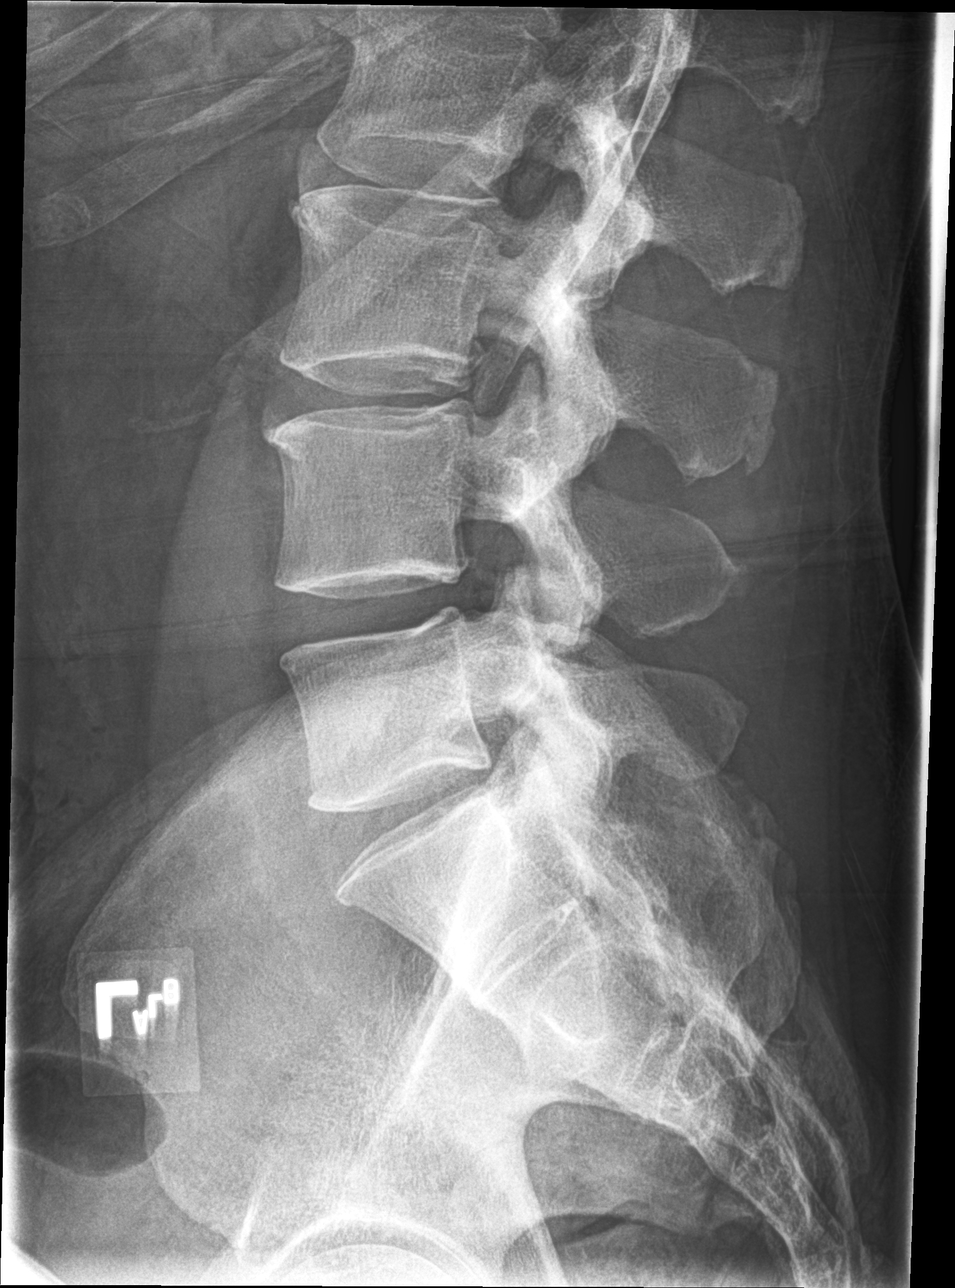

[5 of 5 positions shown; findings below may reference images not displayed]

FINDINGS: Normal alignment. Vertebral body heights are normal. Mild
degenerative osteophytes L1 through L4.
IMPRESSION: Mild degenerative changes.  No acute osseous abnormality.

## 2018-08-20 IMAGING — CR DG RIBS 2V*R*
4 series · 4 of 4 positions shown · non-contrast
Comparison: None.

CLINICAL DATA: Right lower rib and back pain

EXAM:
RIGHT RIBS - 2 VIEW

[rib pa (1 of 2)]
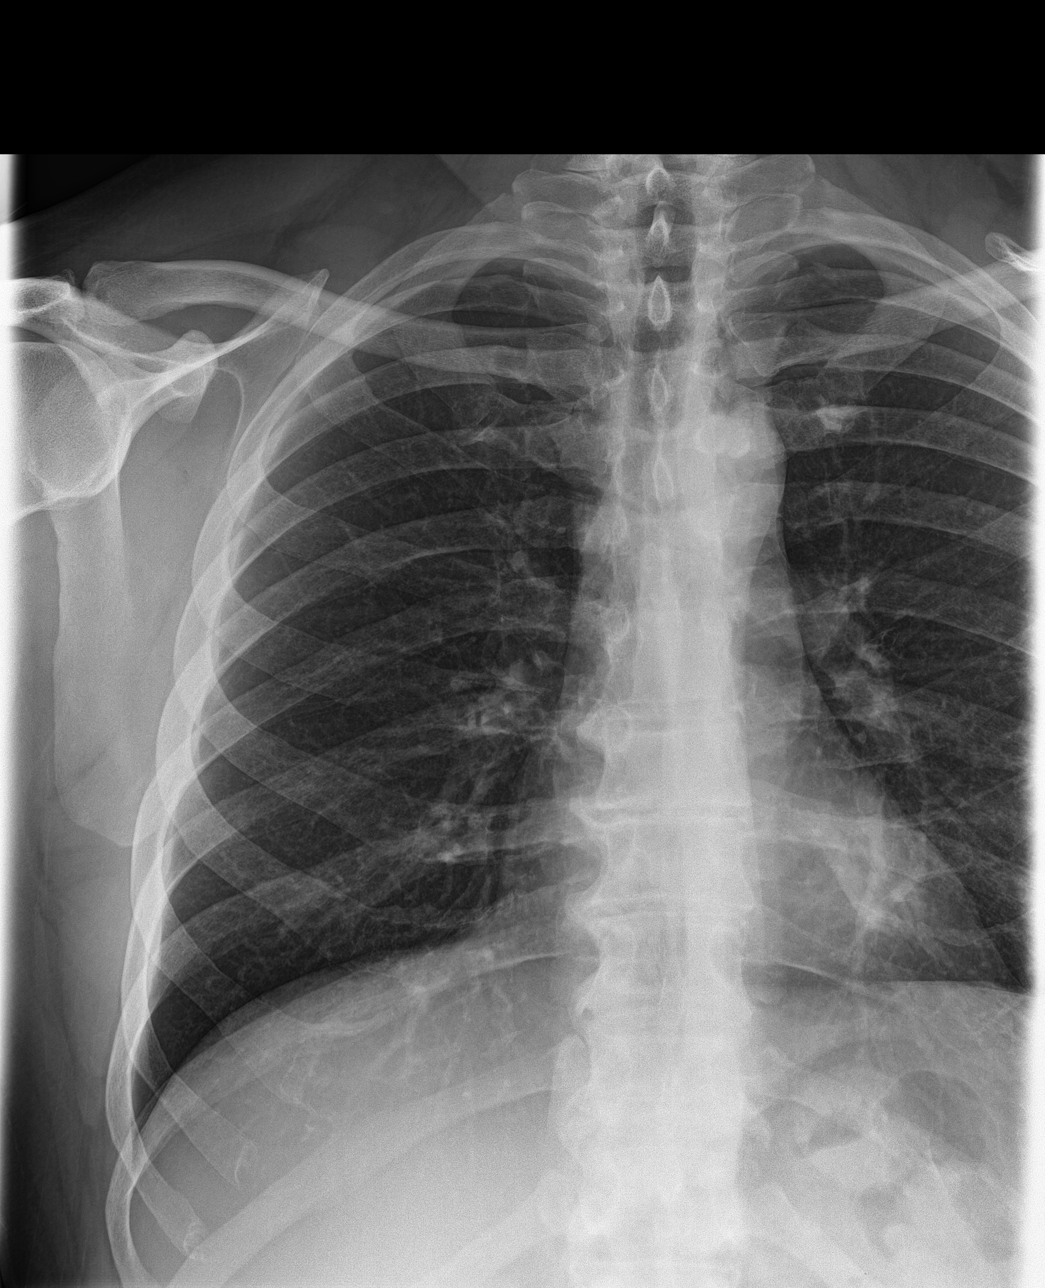

[rib pa (2 of 2)]
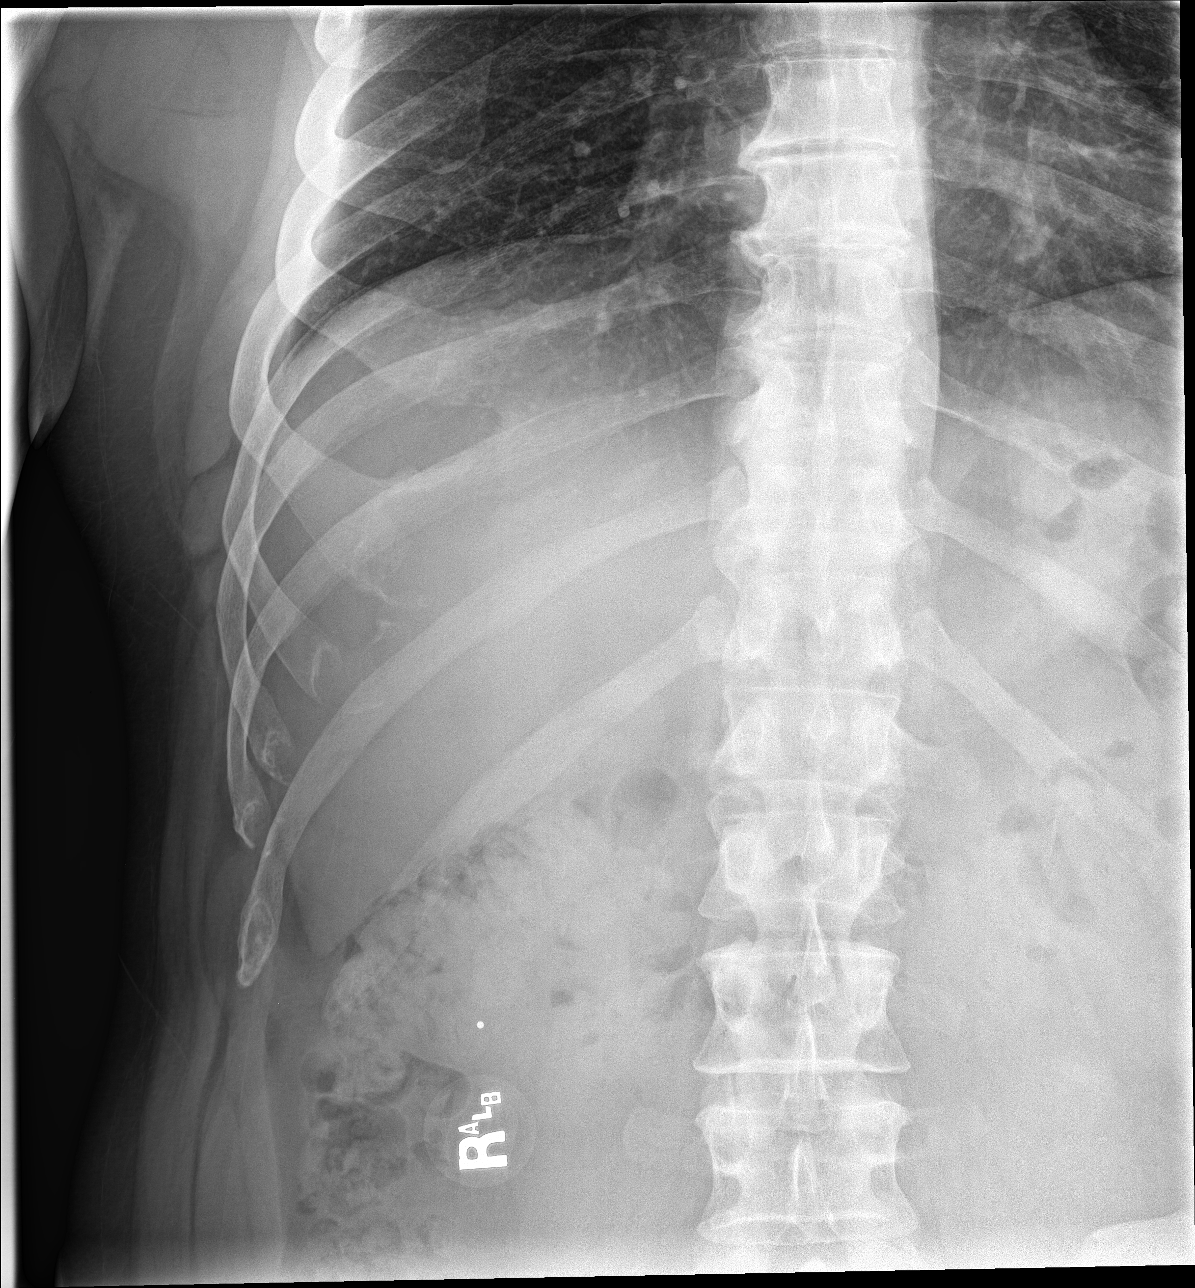

[rib obl (1 of 2)]
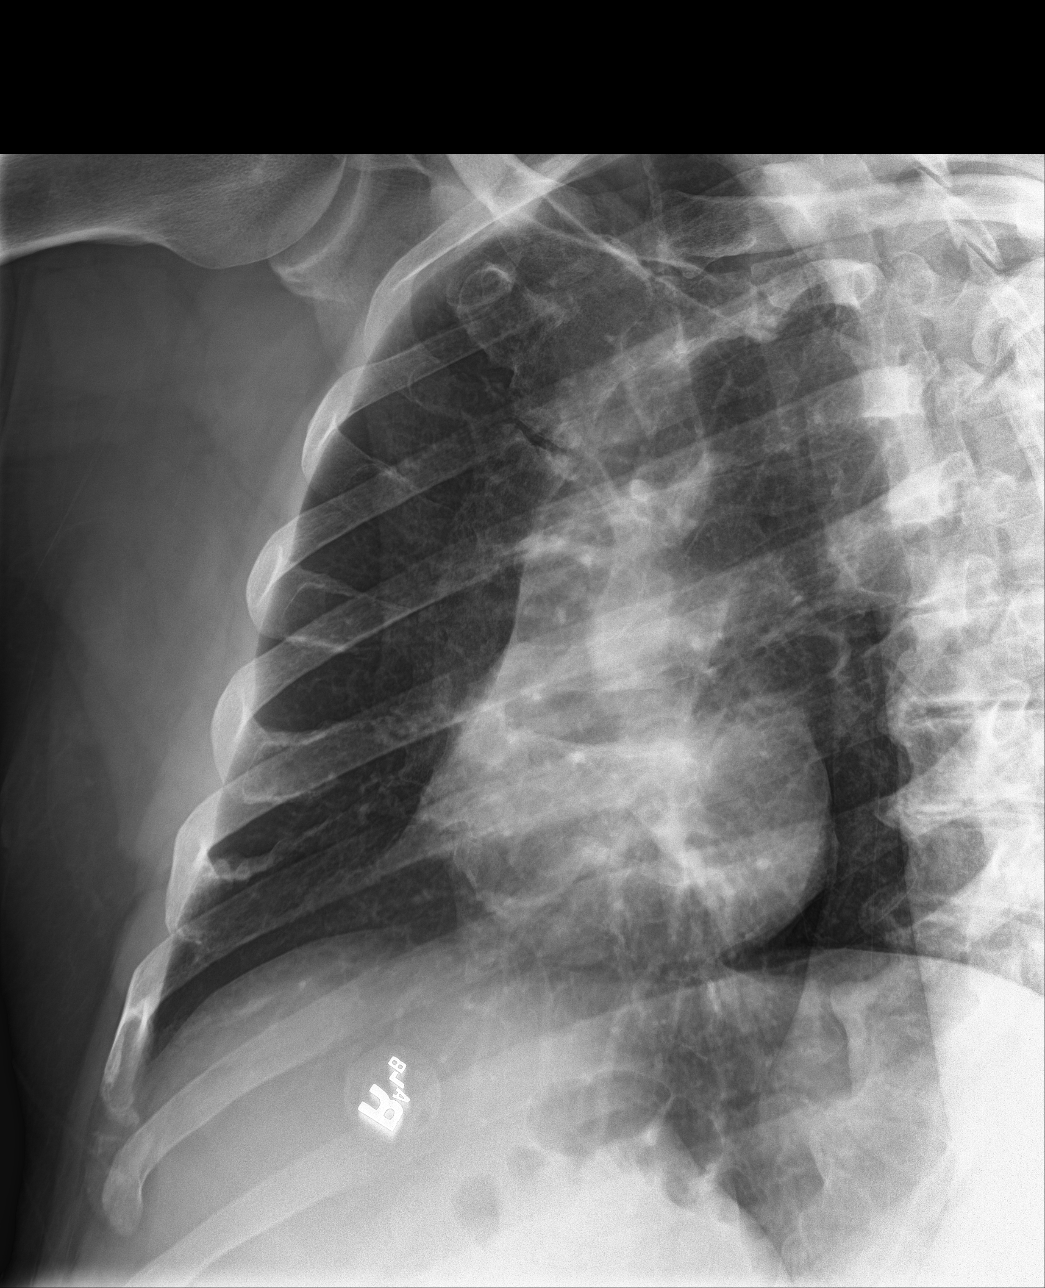

[rib obl (2 of 2)]
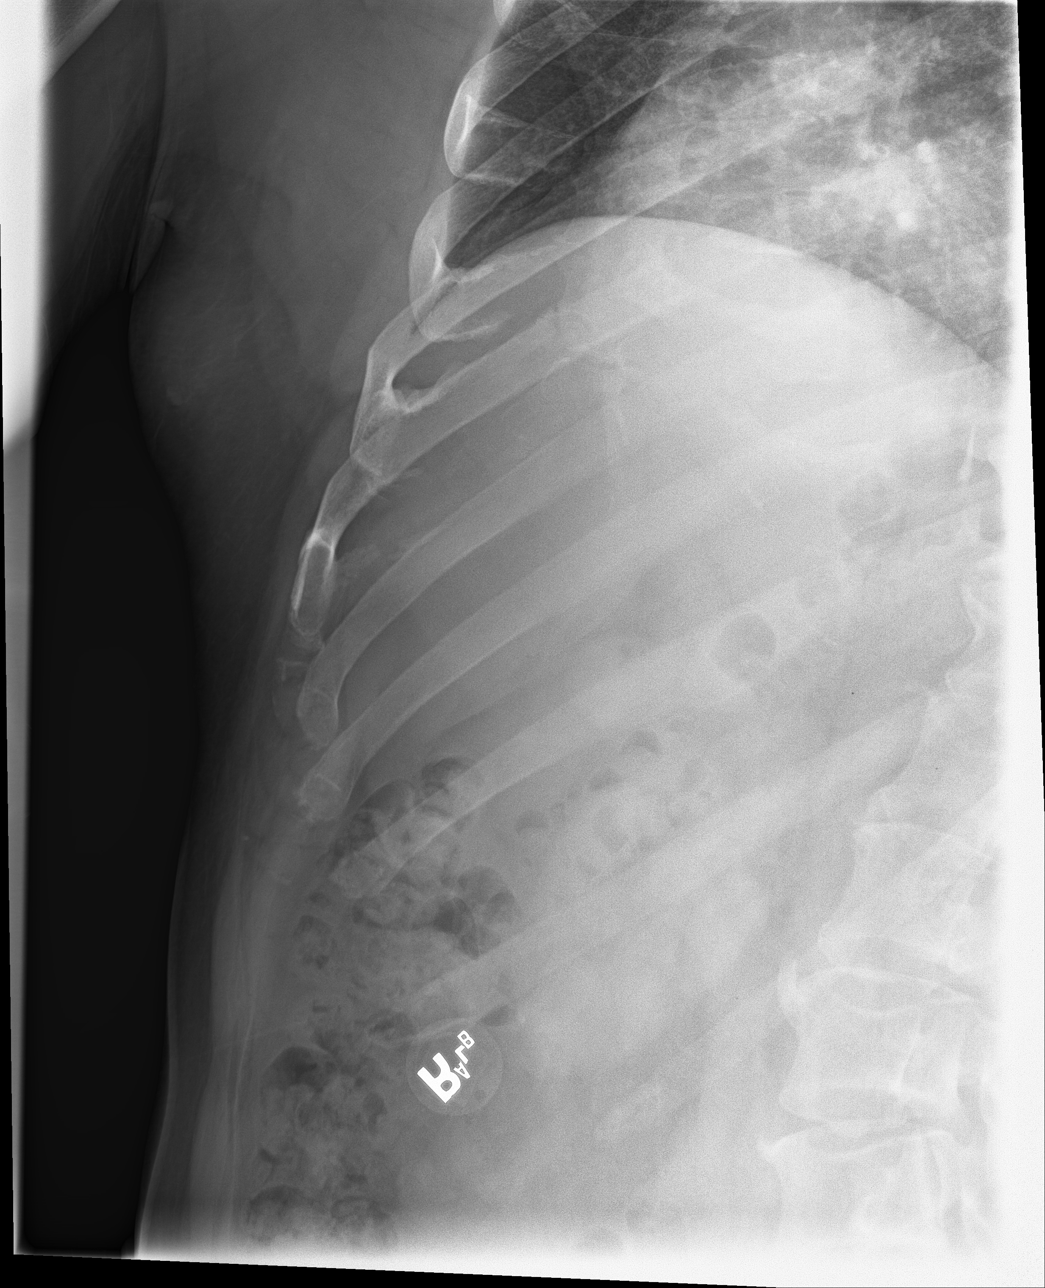

[4 of 4 positions shown; findings below may reference images not displayed]

FINDINGS: No fracture or other bone lesions are seen involving the ribs.
IMPRESSION: Negative.

## 2019-06-13 ENCOUNTER — Other Ambulatory Visit: Payer: Self-pay

## 2019-06-13 ENCOUNTER — Ambulatory Visit
Admission: EM | Admit: 2019-06-13 | Discharge: 2019-06-13 | Disposition: A | Discharge status: Home / Self Care | Payer: BC Managed Care – PPO | Attending: Family Medicine | Admitting: Family Medicine

## 2019-06-13 DIAGNOSIS — R432 Parageusia: Secondary | ICD-10-CM

## 2019-06-13 DIAGNOSIS — G44209 Tension-type headache, unspecified, not intractable: Secondary | ICD-10-CM

## 2019-06-13 DIAGNOSIS — R430 Anosmia: Secondary | ICD-10-CM

## 2019-06-13 NOTE — Discharge Instructions (Signed)
Rest, fluids, over the counter medications as needed °Await test result °

## 2019-06-13 NOTE — ED Triage Notes (Addendum)
Pt presents with complaints of of headache, loss of smell and taste x 1 week and a half. Pt was tested for COVID on Monday and it was negative. Pt complains of worsening symptoms.

## 2019-06-13 NOTE — ED Provider Notes (Signed)
[facility]    CSN: [id number] Arrival date & time: 06/13/19  1515      History   Chief Complaint Chief Complaint  Patient presents with  . Appointment    3  . Headache    HPI BOWDY BAIR is a 53 y.o. male.   53 yo male with headache, loss of smell and taste, here for repeat covid test (tested negative on Monday, however work requiring him to get tested).      Headache   Past Medical History:  Diagnosis Date  . Hypertension     There are no active problems to display for this patient.   Past Surgical History:  Procedure Laterality Date  . NASAL SINUS SURGERY         Home Medications    Prior to Admission medications   Medication Sig Start Date End Date Taking? Authorizing Provider  amoxicillin-clavulanate (AUGMENTIN) 875-125 MG tablet Take 1 tablet by mouth every 12 (twelve) hours. 07/16/18   Enrique Sack, FNP  methylPREDNISolone (MEDROL DOSEPAK) 4 MG TBPK tablet Take as directed 07/16/18   Enrique Sack, FNP    Family History Family History  Problem Relation Age of Onset  . Healthy Mother   . Healthy Father     Social History Social History   Tobacco Use  . Smoking status: Never Smoker  . Smokeless tobacco: Never Used  Substance Use Topics  . Alcohol use: No    Alcohol/week: 0.0 standard drinks  . Drug use: No     Allergies   Patient has no known allergies.   Review of Systems Review of Systems  Neurological: Positive for headaches.     Physical Exam Triage Vital Signs ED Triage Vitals [06/13/19 1605]  Enc Vitals Group     BP (!) 158/90     Pulse Rate 71     Resp 18     Temp 98.4 F (36.9 C)     Temp src      SpO2 99 %     Weight      Height      Head Circumference      Peak Flow      Pain Score 6     Pain Loc      Pain Edu?      Excl. in [place]?    No data found.  Updated Vital Signs BP (!) 158/90   Pulse 71   Temp 98.4 F (36.9 C)   Resp 18   SpO2 99%   Visual Acuity Right Eye  Distance:   Left Eye Distance:   Bilateral Distance:    Right Eye Near:   Left Eye Near:    Bilateral Near:     Physical Exam Vitals signs and nursing note reviewed.      UC Treatments / Results  Labs (all labs ordered are listed, but only abnormal results are displayed) Labs Reviewed  NOVEL CORONAVIRUS, NAA (HOSP ORDER, SEND-OUT TO REF LAB; TAT 18-24 HRS)    EKG   Radiology No results found.  Procedures Procedures (including critical care time)  Medications Ordered in UC Medications - No data to display  Initial Impression / Assessment and Plan / UC Course  I have reviewed the triage vital signs and the nursing notes.  Pertinent labs & imaging results that were available during my care of the patient were reviewed by me and considered in my medical decision making (see chart for details).      Final Clinical  Impressions(s) / UC Diagnoses   Final diagnoses:  Acute non intractable tension-type headache  Loss of smell  Loss of taste     Discharge Instructions     Rest, fluids, over the counter medications as needed Await test result    ED Prescriptions    None     Left without being seen  PDMP not reviewed this encounter.   Payton Mccallum, MD 06/13/19 [phone]

## 2019-06-14 LAB — NOVEL CORONAVIRUS, NAA (HOSP ORDER, SEND-OUT TO REF LAB; TAT 18-24 HRS): SARS-CoV-2, NAA: NOT DETECTED

## 2019-07-19 ENCOUNTER — Other Ambulatory Visit: Payer: Self-pay

## 2019-07-19 ENCOUNTER — Encounter: Payer: Self-pay | Admitting: Family Medicine

## 2019-07-19 ENCOUNTER — Ambulatory Visit (INDEPENDENT_AMBULATORY_CARE_PROVIDER_SITE_OTHER): Payer: BC Managed Care – PPO | Admitting: Family Medicine

## 2019-07-19 VITALS — BP 148/80 | HR 80 | Ht 68.0 in | Wt 210.0 lb

## 2019-07-19 DIAGNOSIS — I10 Essential (primary) hypertension: Secondary | ICD-10-CM

## 2019-07-19 MED ORDER — LISINOPRIL-HYDROCHLOROTHIAZIDE 10-12.5 MG PO TABS: 1.0000 | ORAL_TABLET | Freq: Every day | ORAL | 1 refills | Status: DC

## 2019-07-19 NOTE — Progress Notes (Signed)
Date:  07/19/2019   Name:  Brendan Ray   DOB:  04-05-1966   MRN:  [id number]   Chief Complaint: Hypertension (having headaches and tingling in head- use to be on lisinopril-HCTZ 10/12.5mg )  Hypertension This is a chronic problem. The current episode started more than 1 year ago. The problem has been waxing and waning since onset. The problem is controlled. Associated symptoms include headaches. Pertinent negatives include no anxiety, blurred vision, chest pain, malaise/fatigue, neck pain, orthopnea, palpitations, peripheral edema, PND, shortness of breath or sweats. There are no associated agents to hypertension. Risk factors for coronary artery disease include diabetes mellitus and dyslipidemia. Past treatments include nothing. The current treatment provides moderate improvement. There are no compliance problems.  There is no history of angina, kidney disease, CAD/MI, CVA, heart failure, left ventricular hypertrophy, PVD or retinopathy. There is no history of chronic renal disease, a hypertension causing med or renovascular disease.    Lab Results  Component Value Date   CREATININE 1.11 01/29/2017   BUN 10 01/29/2017   NA 141 01/29/2017   K 4.0 01/29/2017   CL 104 01/29/2017   CO2 20 01/29/2017   Lab Results  Component Value Date   CHOL 173 01/29/2017   HDL 38 (L) 01/29/2017   LDLCALC 114 (H) 01/29/2017   TRIG 105 01/29/2017   CHOLHDL 4.6 01/29/2017   No results found for: TSH No results found for: HGBA1C   Review of Systems  Constitutional: Negative for chills, fever and malaise/fatigue.  HENT: Negative for drooling, ear discharge, ear pain and sore throat.   Eyes: Negative for blurred vision.  Respiratory: Negative for cough, shortness of breath and wheezing.   Cardiovascular: Negative for chest pain, palpitations, orthopnea, leg swelling and PND.  Gastrointestinal: Negative for abdominal pain, blood in stool, constipation, diarrhea and nausea.  Endocrine: Negative for  polydipsia.  Genitourinary: Negative for dysuria, frequency, hematuria and urgency.  Musculoskeletal: Negative for back pain, myalgias and neck pain.  Skin: Negative for color change, pallor and rash.  Allergic/Immunologic: Negative for environmental allergies.  Neurological: Positive for headaches. Negative for dizziness.  Hematological: Does not bruise/bleed easily.  Psychiatric/Behavioral: Negative for suicidal ideas. The patient is not nervous/anxious.     There are no problems to display for this patient.   No Known Allergies  Past Surgical History:  Procedure Laterality Date  . NASAL SINUS SURGERY      Social History   Tobacco Use  . Smoking status: Never Smoker  . Smokeless tobacco: Never Used  Substance Use Topics  . Alcohol use: No    Alcohol/week: 0.0 standard drinks  . Drug use: No     Medication list has been reviewed and updated.  No outpatient medications have been marked as taking for the 07/19/19 encounter (Office Visit) with Duanne Limerick, MD.    [facility] 2/9 Scores 07/06/2018 11/22/2015  PHQ - 2 Score 0 0  PHQ- 9 Score 0 -    BP Readings from Last 3 Encounters:  07/19/19 (!) 148/80  06/13/19 (!) 158/90  07/16/18 (!) 145/88    Physical Exam  Wt Readings from Last 3 Encounters:  07/19/19 210 lb (95.3 kg)  07/16/18 204 lb (92.5 kg)  07/06/18 208 lb (94.3 kg)    BP (!) 148/80   Pulse 80   Ht 5\' 8"  (1.727 m)   Wt 210 lb (95.3 kg)   BMI 31.93 kg/m   Assessment and Plan:  1. Essential hypertension Recurrent.  Previous history  was taken off medication due to weight loss but patient has gained some weight and there is been an elevation of his blood pressure on the last 3 readings in the 162 /100 range.  Patient has had a headache which sounds like a contraction headache with no focality and no residual neurologic concerns.  Patient's been instructed on how to take his own blood pressure with his cuff in acceptable position.  We will resume his  lisinopril hydrochlorothiazide 10-12.5 mg once a day.  Patient will return in 3 months and will recheck blood pressure.  In the meantime I have given him a Dash diet and we have discussed sodium restriction. - lisinopril-hydrochlorothiazide (ZESTORETIC) 10-12.5 MG tablet; Take 1 tablet by mouth daily.  Dispense: 90 tablet; Refill: 1

## 2019-07-19 NOTE — Patient Instructions (Signed)
DASH Eating Plan DASH stands for "Dietary Approaches to Stop Hypertension." The DASH eating plan is a healthy eating plan that has been shown to reduce high blood pressure (hypertension). It may also reduce your risk for type 2 diabetes, heart disease, and stroke. The DASH eating plan may also help with weight loss. What are tips for following this plan?  General guidelines  Avoid eating more than 2,300 mg (milligrams) of salt (sodium) a day. If you have hypertension, you may need to reduce your sodium intake to 1,500 mg a day.  Limit alcohol intake to no more than 1 drink a day for nonpregnant women and 2 drinks a day for men. One drink equals 12 oz of beer, 5 oz of wine, or 1 oz of hard liquor.  Work with your health care provider to maintain a healthy body weight or to lose weight. Ask what an ideal weight is for you.  Get at least 30 minutes of exercise that causes your heart to beat faster (aerobic exercise) most days of the week. Activities may include walking, swimming, or biking.  Work with your health care provider or diet and nutrition specialist (dietitian) to adjust your eating plan to your individual calorie needs. Reading food labels   Check food labels for the amount of sodium per serving. Choose foods with less than 5 percent of the Daily Value of sodium. Generally, foods with less than 300 mg of sodium per serving fit into this eating plan.  To find whole grains, look for the word "whole" as the first word in the ingredient list. Shopping  Buy products labeled as "low-sodium" or "no salt added."  Buy fresh foods. Avoid canned foods and premade or frozen meals. Cooking  Avoid adding salt when cooking. Use salt-free seasonings or herbs instead of table salt or sea salt. Check with your health care provider or pharmacist before using salt substitutes.  Do not fry foods. Cook foods using healthy methods such as baking, boiling, grilling, and broiling instead.  Cook with  heart-healthy oils, such as olive, canola, soybean, or sunflower oil. Meal planning  Eat a balanced diet that includes: ? 5 or more servings of fruits and vegetables each day. At each meal, try to fill half of your plate with fruits and vegetables. ? Up to 6-8 servings of whole grains each day. ? Less than 6 oz of lean meat, poultry, or fish each day. A 3-oz serving of meat is about the same size as a deck of cards. One egg equals 1 oz. ? 2 servings of low-fat dairy each day. ? A serving of nuts, seeds, or beans 5 times each week. ? Heart-healthy fats. Healthy fats called Omega-3 fatty acids are found in foods such as flaxseeds and coldwater fish, like sardines, salmon, and mackerel.  Limit how much you eat of the following: ? Canned or prepackaged foods. ? Food that is high in trans fat, such as fried foods. ? Food that is high in saturated fat, such as fatty meat. ? Sweets, desserts, sugary drinks, and other foods with added sugar. ? Full-fat dairy products.  Do not salt foods before eating.  Try to eat at least 2 vegetarian meals each week.  Eat more home-cooked food and less restaurant, buffet, and fast food.  When eating at a restaurant, ask that your food be prepared with less salt or no salt, if possible. What foods are recommended? The items listed may not be a complete list. Talk with your dietitian about   what dietary choices are best for you. Grains Whole-grain or whole-wheat bread. Whole-grain or whole-wheat pasta. Brown rice. Oatmeal. Quinoa. Bulgur. Whole-grain and low-sodium cereals. Pita bread. Low-fat, low-sodium crackers. Whole-wheat flour tortillas. Vegetables Fresh or frozen vegetables (raw, steamed, roasted, or grilled). Low-sodium or reduced-sodium tomato and vegetable juice. Low-sodium or reduced-sodium tomato sauce and tomato paste. Low-sodium or reduced-sodium canned vegetables. Fruits All fresh, dried, or frozen fruit. Canned fruit in natural juice (without  added sugar). Meat and other protein foods Skinless chicken or turkey. Ground chicken or turkey. Pork with fat trimmed off. Fish and seafood. Egg whites. Dried beans, peas, or lentils. Unsalted nuts, nut butters, and seeds. Unsalted canned beans. Lean cuts of beef with fat trimmed off. Low-sodium, lean deli meat. Dairy Low-fat (1%) or fat-free (skim) milk. Fat-free, low-fat, or reduced-fat cheeses. Nonfat, low-sodium ricotta or cottage cheese. Low-fat or nonfat yogurt. Low-fat, low-sodium cheese. Fats and oils Soft margarine without trans fats. Vegetable oil. Low-fat, reduced-fat, or light mayonnaise and salad dressings (reduced-sodium). Canola, safflower, olive, soybean, and sunflower oils. Avocado. Seasoning and other foods Herbs. Spices. Seasoning mixes without salt. Unsalted popcorn and pretzels. Fat-free sweets. What foods are not recommended? The items listed may not be a complete list. Talk with your dietitian about what dietary choices are best for you. Grains Baked goods made with fat, such as croissants, muffins, or some breads. Dry pasta or rice meal packs. Vegetables Creamed or fried vegetables. Vegetables in a cheese sauce. Regular canned vegetables (not low-sodium or reduced-sodium). Regular canned tomato sauce and paste (not low-sodium or reduced-sodium). Regular tomato and vegetable juice (not low-sodium or reduced-sodium). Pickles. Olives. Fruits Canned fruit in a light or heavy syrup. Fried fruit. Fruit in cream or butter sauce. Meat and other protein foods Fatty cuts of meat. Ribs. Fried meat. Bacon. Sausage. Bologna and other processed lunch meats. Salami. Fatback. Hotdogs. Bratwurst. Salted nuts and seeds. Canned beans with added salt. Canned or smoked fish. Whole eggs or egg yolks. Chicken or turkey with skin. Dairy Whole or 2% milk, cream, and half-and-half. Whole or full-fat cream cheese. Whole-fat or sweetened yogurt. Full-fat cheese. Nondairy creamers. Whipped toppings.  Processed cheese and cheese spreads. Fats and oils Butter. Stick margarine. Lard. Shortening. Ghee. Bacon fat. Tropical oils, such as coconut, palm kernel, or palm oil. Seasoning and other foods Salted popcorn and pretzels. Onion salt, garlic salt, seasoned salt, table salt, and sea salt. Worcestershire sauce. Tartar sauce. Barbecue sauce. Teriyaki sauce. Soy sauce, including reduced-sodium. Steak sauce. Canned and packaged gravies. Fish sauce. Oyster sauce. Cocktail sauce. Horseradish that you find on the shelf. Ketchup. Mustard. Meat flavorings and tenderizers. Bouillon cubes. Hot sauce and Tabasco sauce. Premade or packaged marinades. Premade or packaged taco seasonings. Relishes. Regular salad dressings. Where to find more information:  National Heart, Lung, and Blood Institute: www.nhlbi.nih.gov  American Heart Association: www.heart.org Summary  The DASH eating plan is a healthy eating plan that has been shown to reduce high blood pressure (hypertension). It may also reduce your risk for type 2 diabetes, heart disease, and stroke.  With the DASH eating plan, you should limit salt (sodium) intake to 2,300 mg a day. If you have hypertension, you may need to reduce your sodium intake to 1,500 mg a day.  When on the DASH eating plan, aim to eat more fresh fruits and vegetables, whole grains, lean proteins, low-fat dairy, and heart-healthy fats.  Work with your health care provider or diet and nutrition specialist (dietitian) to adjust your eating plan to your   individual calorie needs. This information is not intended to replace advice given to you by your health care provider. Make sure you discuss any questions you have with your health care provider. Document Released: 06/26/2011 Document Revised: 06/19/2017 Document Reviewed: 06/30/2016 Elsevier Patient Education  2020 [employer]. How to Take Your Blood Pressure You can take your blood pressure at home with a machine. You may need to  check your blood pressure at home:  To check if you have high blood pressure (hypertension).  To check your blood pressure over time.  To make sure your blood pressure medicine is working. Supplies needed: You will need a blood pressure machine, or monitor. You can buy one at a drugstore or online. When choosing one:  Choose one with an arm cuff.  Choose one that wraps around your upper arm. Only one finger should fit between your arm and the cuff.  Do not choose one that measures your blood pressure from your wrist or finger. Your doctor can suggest a monitor. How to prepare Avoid these things for 30 minutes before checking your blood pressure:  Drinking caffeine.  Drinking alcohol.  Eating.  Smoking.  Exercising. Five minutes before checking your blood pressure:  Pee.  Sit in a dining chair. Avoid sitting in a soft couch or armchair.  Be quiet. Do not talk. How to take your blood pressure Follow the instructions that came with your machine. If you have a digital blood pressure monitor, these may be the instructions: 1. Sit up straight. 2. Place your feet on the floor. Do not cross your ankles or legs. 3. Rest your left arm at the level of your heart. You may rest it on a table, desk, or chair. 4. Pull up your shirt sleeve. 5. Wrap the blood pressure cuff around the upper part of your left arm. The cuff should be 1 inch (2.5 cm) above your elbow. It is best to wrap the cuff around bare skin. 6. Fit the cuff snugly around your arm. You should be able to place only one finger between the cuff and your arm. 7. Put the cord inside the groove of your elbow. 8. Press the power button. 9. Sit quietly while the cuff fills with air and loses air. 10. Write down the numbers on the screen. 11. Wait 2-3 minutes and then repeat steps 1-10. What do the numbers mean? Two numbers make up your blood pressure. The first number is called systolic pressure. The second is called  diastolic pressure. An example of a blood pressure reading is "120 over 80" (or 120/80). If you are an adult and do not have a medical condition, use this guide to find out if your blood pressure is normal: Normal  First number: below 120.  Second number: below 80. Elevated  First number: 120-129.  Second number: below 80. Hypertension stage 1  First number: 130-139.  Second number: 80-89. Hypertension stage 2  First number: 140 or above.  Second number: 22 or above. Your blood pressure is above normal even if only the top or bottom number is above normal. Follow these instructions at home:  Check your blood pressure as often as your doctor tells you to.  Take your monitor to your next doctor's appointment. Your doctor will: ? Make sure you are using it correctly. ? Make sure it is working right.  Make sure you understand what your blood pressure numbers should be.  Tell your doctor if your medicines are causing side effects. Contact  a doctor if:  Your blood pressure keeps being high. Get help right away if:  Your first blood pressure number is higher than 180.  Your second blood pressure number is higher than 120. This information is not intended to replace advice given to you by your health care provider. Make sure you discuss any questions you have with your health care provider. Document Released: 06/19/2008 Document Revised: 06/19/2017 Document Reviewed: 12/14/2015 Elsevier Patient Education  2020 [employer].

## 2019-10-19 DIAGNOSIS — Z03818 Encounter for observation for suspected exposure to other biological agents ruled out: Secondary | ICD-10-CM | POA: Diagnosis not present

## 2019-10-19 DIAGNOSIS — Z20828 Contact with and (suspected) exposure to other viral communicable diseases: Secondary | ICD-10-CM | POA: Diagnosis not present

## 2019-12-02 ENCOUNTER — Telehealth: Payer: Self-pay

## 2019-12-02 NOTE — Telephone Encounter (Signed)
Please call pt and schedule a HTN follow up for next month.  On the appt note- please note that he needs to sign a release for his last colonoscopy report.  Thank you.  CM

## 2020-01-02 ENCOUNTER — Ambulatory Visit: Payer: BC Managed Care – PPO | Admitting: Family Medicine

## 2020-01-02 ENCOUNTER — Encounter: Payer: Self-pay | Admitting: Family Medicine

## 2020-01-02 ENCOUNTER — Ambulatory Visit (INDEPENDENT_AMBULATORY_CARE_PROVIDER_SITE_OTHER): Payer: BC Managed Care – PPO | Admitting: Family Medicine

## 2020-01-02 ENCOUNTER — Other Ambulatory Visit: Payer: Self-pay

## 2020-01-02 VITALS — BP 120/62 | HR 80 | Ht 68.0 in | Wt 234.0 lb

## 2020-01-02 DIAGNOSIS — I10 Essential (primary) hypertension: Secondary | ICD-10-CM | POA: Diagnosis not present

## 2020-01-02 DIAGNOSIS — E663 Overweight: Secondary | ICD-10-CM | POA: Diagnosis not present

## 2020-01-02 DIAGNOSIS — Z1211 Encounter for screening for malignant neoplasm of colon: Secondary | ICD-10-CM

## 2020-01-02 DIAGNOSIS — E782 Mixed hyperlipidemia: Secondary | ICD-10-CM | POA: Diagnosis not present

## 2020-01-02 MED ORDER — LISINOPRIL-HYDROCHLOROTHIAZIDE 10-12.5 MG PO TABS: 1.0000 | ORAL_TABLET | Freq: Every day | ORAL | 1 refills | Status: DC

## 2020-01-02 NOTE — Patient Instructions (Signed)

## 2020-01-02 NOTE — Progress Notes (Signed)
Date:  01/02/2020   Name:  Brendan Ray   DOB:  February 26, 1966   MRN:  [id number]   Chief Complaint: Hypertension (Follow up. )  Hypertension This is a chronic problem. The current episode started more than 1 year ago. The problem has been gradually improving since onset. The problem is controlled. Pertinent negatives include no anxiety, blurred vision, chest pain, headaches, malaise/fatigue, neck pain, orthopnea, palpitations, peripheral edema, PND or shortness of breath. There are no associated agents to hypertension. Risk factors for coronary artery disease include male gender. Past treatments include ACE inhibitors and diuretics. The current treatment provides moderate improvement. There are no compliance problems.  There is no history of angina, kidney disease, CAD/MI, CVA, heart failure, left ventricular hypertrophy, PVD or retinopathy. There is no history of chronic renal disease, a hypertension causing med or renovascular disease.    Lab Results  Component Value Date   CREATININE 1.11 01/29/2017   BUN 10 01/29/2017   NA 141 01/29/2017   K 4.0 01/29/2017   CL 104 01/29/2017   CO2 20 01/29/2017   Lab Results  Component Value Date   CHOL 173 01/29/2017   HDL 38 (L) 01/29/2017   LDLCALC 114 (H) 01/29/2017   TRIG 105 01/29/2017   CHOLHDL 4.6 01/29/2017   No results found for: TSH No results found for: HGBA1C No results found for: WBC, HGB, HCT, MCV, PLT No results found for: ALT, AST, GGT, ALKPHOS, BILITOT   Review of Systems  Constitutional: Negative for chills, fever and malaise/fatigue.  HENT: Negative for drooling, ear discharge, ear pain and sore throat.   Eyes: Negative for blurred vision.  Respiratory: Negative for cough, shortness of breath and wheezing.   Cardiovascular: Negative for chest pain, palpitations, orthopnea, leg swelling and PND.  Gastrointestinal: Negative for abdominal pain, blood in stool, constipation, diarrhea and nausea.  Endocrine: Negative for  polydipsia.  Genitourinary: Negative for dysuria, frequency, hematuria and urgency.  Musculoskeletal: Negative for back pain, myalgias and neck pain.  Skin: Negative for rash.  Allergic/Immunologic: Negative for environmental allergies.  Neurological: Negative for dizziness and headaches.  Hematological: Does not bruise/bleed easily.  Psychiatric/Behavioral: Negative for suicidal ideas. The patient is not nervous/anxious.     There are no problems to display for this patient.   No Known Allergies  Past Surgical History:  Procedure Laterality Date   NASAL SINUS SURGERY      Social History   Tobacco Use   Smoking status: Never Smoker   Smokeless tobacco: Never Used  Substance Use Topics   Alcohol use: No    Alcohol/week: 0.0 standard drinks   Drug use: No     Medication list has been reviewed and updated.  Current Meds  Medication Sig   lisinopril-hydrochlorothiazide (ZESTORETIC) 10-12.5 MG tablet Take 1 tablet by mouth daily.    PHQ 2/9 Scores 01/02/2020 07/06/2018 11/22/2015  PHQ - 2 Score 0 0 0  PHQ- 9 Score 0 0 -    GAD 7 : Generalized Anxiety Score 01/02/2020  Nervous, Anxious, on Edge 0  Control/stop worrying 0  Worry too much - different things 0  Trouble relaxing 0  Restless 0  Easily annoyed or irritable 0  Afraid - awful might happen 0  Total GAD 7 Score 0    BP Readings from Last 3 Encounters:  01/02/20 120/62  07/19/19 (!) 148/80  06/13/19 (!) 158/90    Physical Exam Vitals and nursing note reviewed.  HENT:     Head:  Normocephalic.     Right Ear: External ear normal.     Left Ear: External ear normal.     Nose: Nose normal.  Eyes:     General: No scleral icterus.       Right eye: No discharge.        Left eye: No discharge.     Conjunctiva/sclera: Conjunctivae normal.     Pupils: Pupils are equal, round, and reactive to light.  Neck:     Thyroid: No thyromegaly.     Vascular: No JVD.     Trachea: No tracheal deviation.    Cardiovascular:     Rate and Rhythm: Normal rate and regular rhythm.     Heart sounds: Normal heart sounds. No murmur heard.  No friction rub. No gallop.   Pulmonary:     Effort: No respiratory distress.     Breath sounds: Normal breath sounds. No wheezing, rhonchi or rales.  Abdominal:     General: Bowel sounds are normal.     Palpations: Abdomen is soft. There is no mass.     Tenderness: There is no abdominal tenderness. There is no guarding or rebound.  Musculoskeletal:        General: No tenderness. Normal range of motion.     Cervical back: Normal range of motion and neck supple.  Lymphadenopathy:     Cervical: No cervical adenopathy.  Skin:    General: Skin is warm.     Findings: No rash.  Neurological:     Mental Status: He is alert and oriented to person, place, and time.     Cranial Nerves: No cranial nerve deficit.     Deep Tendon Reflexes: Reflexes are normal and symmetric.     Wt Readings from Last 3 Encounters:  01/02/20 234 lb (106.1 kg)  07/19/19 210 lb (95.3 kg)  07/16/18 204 lb (92.5 kg)    BP 120/62    Pulse 80    Ht 5\' 8"  (1.727 m)    Wt 234 lb (106.1 kg)    BMI 35.58 kg/m   Assessment and Plan:  1. Essential hypertension Chronic.  Controlled.  Stable.  Continue lisinopril hydrochlorothiazide 10-12 0.5 once a day.  Will check renal function panel. - lisinopril-hydrochlorothiazide (ZESTORETIC) 10-12.5 MG tablet; Take 1 tablet by mouth daily.  Dispense: 90 tablet; Refill: 1 - Renal function panel  2. Overweight (BMI 25.0-29.9) Chronic.  Uncontrolled.  Stable.  Patient has been given low-cholesterol sheet for review and to lower cholesterol and hopefully lose weight. - Lipid Panel With LDL/HDL Ratio  3. Colon cancer screening Discussed and patient has been referred to Dr. Allen Norris gastroenterology for evaluation and possible colonoscopy - Ambulatory referral to Gastroenterology  4. Moderate mixed hyperlipidemia not requiring statin therapy Chronic.   Controlled.  Mild elevation of LDL.  Stable.  Will check lipid panel and patient has been given low-cholesterol sheet. - Lipid Panel With LDL/HDL Ratio

## 2020-01-03 LAB — LIPID PANEL WITH LDL/HDL RATIO
Cholesterol, Total: 215 mg/dL — ABNORMAL HIGH (ref 100–199)
HDL: 39 mg/dL — ABNORMAL LOW (ref >39)
LDL Chol Calc (NIH): 133 mg/dL — ABNORMAL HIGH (ref 0–99)
LDL/HDL Ratio: 3.4 ratio (ref 0.0–3.6)
Triglycerides: 237 mg/dL — ABNORMAL HIGH (ref 0–149)
VLDL Cholesterol Cal: 43 mg/dL — ABNORMAL HIGH (ref 5–40)

## 2020-01-03 LAB — RENAL FUNCTION PANEL
Albumin: 4.2 g/dL (ref 3.8–4.9)
BUN/Creatinine Ratio: 10 (ref 9–20)
BUN: 12 mg/dL (ref 6–24)
CO2: 23 mmol/L (ref 20–29)
Calcium: 9.6 mg/dL (ref 8.7–10.2)
Chloride: 99 mmol/L (ref 96–106)
Creatinine, Ser: 1.17 mg/dL (ref 0.76–1.27)
GFR calc Af Amer: 81 mL/min/{1.73_m2} (ref >59)
GFR calc non Af Amer: 70 mL/min/{1.73_m2} (ref >59)
Glucose: 111 mg/dL — ABNORMAL HIGH (ref 65–99)
Phosphorus: 3.4 mg/dL (ref 2.8–4.1)
Potassium: 4 mmol/L (ref 3.5–5.2)
Sodium: 137 mmol/L (ref 134–144)

## 2020-01-05 ENCOUNTER — Other Ambulatory Visit: Payer: Self-pay

## 2020-01-05 DIAGNOSIS — E782 Mixed hyperlipidemia: Secondary | ICD-10-CM

## 2020-01-05 MED ORDER — ATORVASTATIN CALCIUM 10 MG PO TABS: 10.0000 mg | ORAL_TABLET | Freq: Every day | ORAL | 1 refills | Status: DC

## 2020-01-05 NOTE — Progress Notes (Unsigned)
Sent in Atorv to [facility] [place]

## 2020-01-10 ENCOUNTER — Other Ambulatory Visit: Payer: Self-pay

## 2020-01-10 ENCOUNTER — Telehealth: Payer: Self-pay

## 2020-01-10 ENCOUNTER — Other Ambulatory Visit: Payer: Self-pay | Admitting: Gastroenterology

## 2020-01-10 DIAGNOSIS — Z1211 Encounter for screening for malignant neoplasm of colon: Secondary | ICD-10-CM

## 2020-01-10 MED ORDER — NA SULFATE-K SULFATE-MG SULF 17.5-3.13-1.6 GM/177ML PO SOLN: 1.0000 | Freq: Once | ORAL | 0 refills | Status: AC

## 2020-01-10 NOTE — Telephone Encounter (Signed)
Gastroenterology Pre-Procedure Review  Request Date: Thursday 01/19/20 Requesting Physician: Dr. Servando Snare  PATIENT REVIEW QUESTIONS: The patient responded to the following health history questions as indicated:    1. Are you having any GI issues? no 2. Do you have a personal history of Polyps? no 3. Do you have a family history of Colon Cancer or Polyps? no 4. Diabetes Mellitus? no 5. Joint replacements in the past 12 months?no 6. Major health problems in the past 3 months?no 7. Any artificial heart valves, MVP, or defibrillator?no    MEDICATIONS & ALLERGIES:    Patient reports the following regarding taking any anticoagulation/antiplatelet therapy:   Plavix, Coumadin, Eliquis, Xarelto, Lovenox, Pradaxa, Brilinta, or Effient? no Aspirin? no  Patient confirms/reports the following medications:  Current Outpatient Medications  Medication Sig Dispense Refill  . atorvastatin (LIPITOR) 10 MG tablet Take 1 tablet (10 mg total) by mouth daily. 30 tablet 1  . lisinopril-hydrochlorothiazide (ZESTORETIC) 10-12.5 MG tablet Take 1 tablet by mouth daily. 90 tablet 1   No current facility-administered medications for this visit.    Patient confirms/reports the following allergies:  No Known Allergies  No orders of the defined types were placed in this encounter.   AUTHORIZATION INFORMATION Primary Insurance: 1D#: Group #:  Secondary Insurance: 1D#: Group #:  SCHEDULE INFORMATION: Date: 01/19/20 Time: Location:[facility]

## 2020-01-12 ENCOUNTER — Encounter: Payer: Self-pay | Admitting: Anesthesiology

## 2020-01-12 ENCOUNTER — Other Ambulatory Visit: Payer: Self-pay

## 2020-01-12 ENCOUNTER — Encounter: Payer: Self-pay | Admitting: Gastroenterology

## 2020-01-16 ENCOUNTER — Other Ambulatory Visit: Payer: Self-pay

## 2020-01-16 MED ORDER — PEG 3350-KCL-NA BICARB-NACL 420 G PO SOLR: ORAL | 0 refills | Status: DC

## 2020-01-17 ENCOUNTER — Other Ambulatory Visit: Payer: BC Managed Care – PPO

## 2020-01-19 ENCOUNTER — Ambulatory Visit
Admission: RE | Admit: 2020-01-19 | Payer: BC Managed Care – PPO | Source: Home / Self Care | Admitting: Gastroenterology

## 2020-01-19 SURGERY — COLONOSCOPY WITH PROPOFOL
Anesthesia: Choice

## 2020-01-25 ENCOUNTER — Ambulatory Visit (INDEPENDENT_AMBULATORY_CARE_PROVIDER_SITE_OTHER): Payer: BC Managed Care – PPO

## 2020-01-25 ENCOUNTER — Ambulatory Visit
Admission: EM | Admit: 2020-01-25 | Discharge: 2020-01-25 | Disposition: A | Discharge status: Home / Self Care | Payer: BC Managed Care – PPO | Attending: Emergency Medicine | Admitting: Emergency Medicine

## 2020-01-25 ENCOUNTER — Other Ambulatory Visit: Payer: Self-pay

## 2020-01-25 ENCOUNTER — Encounter: Payer: Self-pay | Admitting: Emergency Medicine

## 2020-01-25 DIAGNOSIS — M19011 Primary osteoarthritis, right shoulder: Secondary | ICD-10-CM | POA: Diagnosis not present

## 2020-01-25 DIAGNOSIS — M25511 Pain in right shoulder: Secondary | ICD-10-CM

## 2020-01-25 MED ORDER — OXYCODONE-ACETAMINOPHEN 5-325 MG PO TABS: 1.0000 | ORAL_TABLET | Freq: Three times a day (TID) | ORAL | 0 refills | Status: DC | PRN

## 2020-01-25 MED ORDER — MELOXICAM 15 MG PO TABS: 15.0000 mg | ORAL_TABLET | Freq: Every day | ORAL | 0 refills | Status: DC | PRN

## 2020-01-25 NOTE — ED Triage Notes (Signed)
Pt c/o right shoulder pain that radiates into his elbow. Started about 6 days ago. No known injury. He has decreased ROM. The pain is keeping him from sleeping. He has tried ibuprofen, ice without relief.

## 2020-01-25 NOTE — Discharge Instructions (Signed)
Take medication as prescribed. Rest. Ice. Pendulum (circles) exercises.  Follow-up with orthopedic as discussed.  Follow up with your primary care physician this week as needed. Return to Urgent care for new or worsening concerns.

## 2020-01-25 NOTE — ED Provider Notes (Signed)
[facility] ____________________________________________  Time seen: Approximately 6:32 PM  I have reviewed the triage vital signs and the nursing notes.   HISTORY  Chief Complaint Shoulder Pain (right)   HPI Brendan Ray is a 54 y.o. male presenting for evaluation of right shoulder pain present since last Thursday or Friday.  States pain is directly in the right shoulder and intermittently radiates to right mid proximal arm with movement.  States pain improves when at rest, but with any movement pain increases again.  States pain intermittently is catching.  Denies paresthesias or further pain radiation.  Denies hand weakness.  Denies fall, direct injury or known trauma.  Right-hand-dominant.  Denies known triggers.  Has tried meloxicam for the last 2 days without resolution.  Has also applied ice.  Denies chest pain, shortness of breath, rash or other complaints.  Duanne Limerick, MD :PCP  Past Medical History:  Diagnosis Date  . Hypertension     There are no problems to display for this patient.   Past Surgical History:  Procedure Laterality Date  . NASAL SINUS SURGERY       No current facility-administered medications for this encounter.  Current Outpatient Medications:  .  lisinopril-hydrochlorothiazide (ZESTORETIC) 10-12.5 MG tablet, Take 1 tablet by mouth daily., Disp: 90 tablet, Rfl: 1 .  atorvastatin (LIPITOR) 10 MG tablet, Take 1 tablet (10 mg total) by mouth daily., Disp: 30 tablet, Rfl: 1 .  meloxicam (MOBIC) 15 MG tablet, Take 1 tablet (15 mg total) by mouth daily as needed., Disp: 20 tablet, Rfl: 0 .  oxyCODONE-acetaminophen (PERCOCET/ROXICET) 5-325 MG tablet, Take 1 tablet by mouth every 8 (eight) hours as needed for moderate pain or severe pain. Do not drive while taking as can cause drowsiness., Disp: 12 tablet, Rfl: 0 .  polyethylene glycol-electrolytes (NULYTELY) 420 g solution, Drink one 8 oz glass every 20 mins until entire container is  finished starting at 5:00pm on 01/18/20., Disp: 4000 mL, Rfl: 0  Allergies Patient has no known allergies.  Family History  Problem Relation Age of Onset  . Healthy Mother   . Healthy Father     Social History Social History   Tobacco Use  . Smoking status: Never Smoker  . Smokeless tobacco: Never Used  Vaping Use  . Vaping Use: Never used  Substance Use Topics  . Alcohol use: No    Alcohol/week: 0.0 standard drinks  . Drug use: No    Review of Systems Constitutional: No fever Cardiovascular: Denies chest pain. Respiratory: Denies shortness of breath. Gastrointestinal: No abdominal pain.   Musculoskeletal: Negative for back pain. Positive right shoulder pain.  Skin: Negative for rash. Neurological: Negative for focal weakness or numbness.   ____________________________________________   PHYSICAL EXAM:  VITAL SIGNS: ED Triage Vitals  Enc Vitals Group     BP 01/25/20 1743 140/79     Pulse Rate 01/25/20 1743 77     Resp 01/25/20 1743 18     Temp 01/25/20 1743 98.2 F (36.8 C)     Temp Source 01/25/20 1743 Oral     SpO2 01/25/20 1743 97 %     Weight 01/25/20 1741 233 lb 14.5 oz (106.1 kg)     Height 01/25/20 1741 5\' 8"  (1.727 m)     Head Circumference --      Peak Flow --      Pain Score 01/25/20 1741 5     Pain Loc --      Pain Edu? --  Excl. in GC? --     Constitutional: Alert and oriented. Well appearing and in no acute distress. Eyes: Conjunctivae are normal.  ENT      Head: Normocephalic and atraumatic. Cardiovascular:Good peripheral circulation. Respiratory: Normal respiratory effort without tachypnea nor retractions.  Musculoskeletal:Steady gait. Bilateral hand grips strong. Bilateral distal radial pulses equal.  Except: Right proximal anterior shoulder tenderness palpation with pain to [facility] joint and proximal biceps and deltoid, no swelling, no ecchymosis, no rash, pain with abduction, negative drop arm test, positive empty can test, good  strength, right upper extremity otherwise nontender. Neurologic:  Normal speech and language. Speech is normal. No gait instability.  Skin:  Skin is warm, dry and intact. No rash noted. Psychiatric: Mood and affect are normal. Speech and behavior are normal. Patient exhibits appropriate insight and judgment   ___________________________________________   LABS (all labs ordered are listed, but only abnormal results are displayed)  Labs Reviewed - No data to display  RADIOLOGY  DG Shoulder Right  Result Date: 01/25/2020 CLINICAL DATA:  Right shoulder pain, no known injury, initial encounter EXAM: RIGHT SHOULDER - 2+ VIEW COMPARISON:  None. FINDINGS: Mild degenerative changes of the acromioclavicular joint are noted. No acute fracture or dislocation is noted. No soft tissue abnormality is seen. IMPRESSION: No acute abnormality noted. Electronically Signed   By: Alcide Clever M.D.   On: 01/25/2020 19:07   ____________________________________________   PROCEDURES Procedures    INITIAL IMPRESSION / ASSESSMENT AND PLAN / ED COURSE  Pertinent labs & imaging results that were available during my care of the patient were reviewed by me and considered in my medical decision making (see chart for details).  well-appearing patient.  Right shoulder pain as above.  Discussed multiple differentials with patient, concern for tendinitis versus tendon versus muscular tear.  Right shoulder x-ray negative.  Will treat with oral Mobic and as needed Percocet.  Pendulum exercises, ice and follow-up with orthopedic.Discussed indication, risks and benefits of medications with patient.   Discussed follow up with Primary care physician this week. Discussed follow up and return parameters including no resolution or any worsening concerns. Patient verbalized understanding and agreed to plan.   [place] controlled substance database reviewed, no  issues.  ____________________________________________   FINAL CLINICAL IMPRESSION(S) / ED DIAGNOSES  Final diagnoses:  Acute pain of right shoulder     ED Discharge Orders         Ordered    meloxicam (MOBIC) 15 MG tablet  Daily PRN     Discontinue  Reprint     01/25/20 1921    oxyCODONE-acetaminophen (PERCOCET/ROXICET) 5-325 MG tablet  Every 8 hours PRN     Discontinue  Reprint     01/25/20 1921           Note: This dictation was prepared with Dragon dictation along with smaller phrase technology. Any transcriptional errors that result from this process are unintentional.        Renford Dills, NP 01/25/20 2059

## 2020-02-02 ENCOUNTER — Other Ambulatory Visit: Payer: Self-pay | Admitting: Family Medicine

## 2020-02-02 DIAGNOSIS — E782 Mixed hyperlipidemia: Secondary | ICD-10-CM

## 2020-03-06 ENCOUNTER — Other Ambulatory Visit: Payer: Self-pay | Admitting: Family Medicine

## 2020-03-06 DIAGNOSIS — I10 Essential (primary) hypertension: Secondary | ICD-10-CM

## 2020-03-06 NOTE — Telephone Encounter (Signed)
lisinopril-hydrochlorothiazide (ZESTORETIC) 10-12.5 MG tablet     Patient is requesting refill.    Pharmacy:  [id number] [place], [place] - [address] Phone:  [phone]  Fax:  [phone]

## 2020-03-14 ENCOUNTER — Other Ambulatory Visit: Payer: Self-pay

## 2020-03-14 ENCOUNTER — Ambulatory Visit
Admission: EM | Admit: 2020-03-14 | Discharge: 2020-03-14 | Disposition: A | Discharge status: Home / Self Care | Payer: BC Managed Care – PPO | Attending: Family Medicine | Admitting: Family Medicine

## 2020-03-14 DIAGNOSIS — I889 Nonspecific lymphadenitis, unspecified: Secondary | ICD-10-CM | POA: Insufficient documentation

## 2020-03-14 DIAGNOSIS — Z1152 Encounter for screening for COVID-19: Secondary | ICD-10-CM | POA: Insufficient documentation

## 2020-03-14 DIAGNOSIS — J029 Acute pharyngitis, unspecified: Secondary | ICD-10-CM | POA: Insufficient documentation

## 2020-03-14 LAB — POCT RAPID STREP A (OFFICE): Rapid Strep A Screen: NEGATIVE

## 2020-03-14 MED ORDER — AZITHROMYCIN 250 MG PO TABS: 250.0000 mg | ORAL_TABLET | Freq: Every day | ORAL | 0 refills | Status: DC

## 2020-03-14 MED ORDER — DEXAMETHASONE SODIUM PHOSPHATE 10 MG/ML IJ SOLN
10.0000 mg | Freq: Once | INTRAMUSCULAR | Status: AC
  Administered 2020-03-14: 10 mg via INTRAMUSCULAR

## 2020-03-14 NOTE — ED Triage Notes (Signed)
Pt presents with sharp pain on the left side only and TTP from outside.  Feels it did not start like a normal scratchy sore throat but immediate sharp pain on left side that came on suddenly, immediately after eating.  Did not choke or eat anything that could scratchy- just meatloaf and potatoes. When he turns head to right it causes pain in left side.  Pain has worsened through day.    Increased pain with swallowing but no difficulty swallowing and no changes in voice.    Denies nasal congestion, sinus pressure, body aches, cough, etc.   Pt requests COVID testing for work.

## 2020-03-14 NOTE — ED Provider Notes (Signed)
[facility]   [id number] 03/14/20 Arrival Time: 1420  LA:GTXM THROAT  SUBJECTIVE: History from: patient.  Brendan Ray is a 54 y.o. male who presents with abrupt onset of sore throat for .  Denies sick exposure to Covid, strep, flu or mono, or precipitating event. Requesting Covid test for work. Has tried advil without relief. Has negative history of Covid. Has not completed Covid vaccines. Symptoms are made worse with swallowing, but tolerating liquids and own secretions without difficulty.  Denies previous symptoms in the past.     Denies fever, chills, fatigue, ear pain, sinus pain, rhinorrhea, nasal congestion, cough, SOB, wheezing, chest pain, nausea, rash, changes in bowel or bladder habits.     ROS: As per HPI.  All other pertinent ROS negative.     Past Medical History:  Diagnosis Date   Hypertension    Past Surgical History:  Procedure Laterality Date   NASAL SINUS SURGERY     No Known Allergies No current facility-administered medications on file prior to encounter.   Current Outpatient Medications on File Prior to Encounter  Medication Sig Dispense Refill   lisinopril-hydrochlorothiazide (ZESTORETIC) 10-12.5 MG tablet Take 1 tablet by mouth daily. 90 tablet 1   atorvastatin (LIPITOR) 10 MG tablet Take 1 tablet (10 mg total) by mouth daily. 30 tablet 1   meloxicam (MOBIC) 15 MG tablet Take 1 tablet (15 mg total) by mouth daily as needed. 20 tablet 0   oxyCODONE-acetaminophen (PERCOCET/ROXICET) 5-325 MG tablet Take 1 tablet by mouth every 8 (eight) hours as needed for moderate pain or severe pain. Do not drive while taking as can cause drowsiness. 12 tablet 0   polyethylene glycol-electrolytes (NULYTELY) 420 g solution Drink one 8 oz glass every 20 mins until entire container is finished starting at 5:00pm on 01/18/20. 4000 mL 0   Social History   Socioeconomic History   Marital status: Married    Spouse name: Not on file   Number of children: Not  on file   Years of education: Not on file   Highest education level: Not on file  Occupational History   Not on file  Tobacco Use   Smoking status: Never Smoker   Smokeless tobacco: Never Used  Vaping Use   Vaping Use: Never used  Substance and Sexual Activity   Alcohol use: No    Alcohol/week: 0.0 standard drinks   Drug use: No   Sexual activity: Yes  Other Topics Concern   Not on file  Social History Narrative   Not on file   Social Determinants of Health   Financial Resource Strain:    Difficulty of Paying Living Expenses: Not on file  Food Insecurity:    Worried About Running Out of Food in the Last Year: Not on file   Ran Out of Food in the Last Year: Not on file  Transportation Needs:    Lack of Transportation (Medical): Not on file   Lack of Transportation (Non-Medical): Not on file  Physical Activity:    Days of Exercise per Week: Not on file   Minutes of Exercise per Session: Not on file  Stress:    Feeling of Stress : Not on file  Social Connections:    Frequency of Communication with Friends and Family: Not on file   Frequency of Social Gatherings with Friends and Family: Not on file   Attends Religious Services: Not on file   Active Member of Clubs or Organizations: Not on file   Attends Club  or Organization Meetings: Not on file   Marital Status: Not on file  Intimate Partner Violence:    Fear of Current or Ex-Partner: Not on file   Emotionally Abused: Not on file   Physically Abused: Not on file   Sexually Abused: Not on file   Family History  Problem Relation Age of Onset   Healthy Mother    Healthy Father     OBJECTIVE:  Vitals:   03/14/20 1433  BP: 124/77  Pulse: 77  Resp: 18  Temp: 98.4 F (36.9 C)  TempSrc: Oral  SpO2: 97%     General appearance: alert; appears fatigued, but nontoxic, speaking in full sentences and managing own secretions HEENT: NCAT; Ears: EACs clear, TMs pearly gray with visible  cone of light, without erythema; Eyes: PERRL, EOMI grossly; Nose: no obvious rhinorrhea; Throat: oropharynx clear, tonsils 1+ and mildly erythematous without white tonsillar exudates, uvula midline Neck: supple with swollen and tender lymph nodes on L neck Lungs: CTA bilaterally without adventitious breath sounds; cough absent Heart: regular rate and rhythm.  Radial pulses 2+ symmetrical bilaterally Skin: warm and dry Psychological: alert and cooperative; normal mood and affect  LABS: Results for orders placed or performed during the hospital encounter of 03/14/20 (from the past 24 hour(s))  POCT rapid strep A     Status: None   Collection Time: 03/14/20  2:56 PM  Result Value Ref Range   Rapid Strep A Screen Negative Negative     ASSESSMENT & PLAN:  1. Acute pharyngitis, unspecified etiology   2. Lymphadenitis   3. Encounter for screening for COVID-19     Meds ordered this encounter  Medications   dexamethasone (DECADRON) injection 10 mg   azithromycin (ZITHROMAX) 250 MG tablet    Sig: Take 1 tablet (250 mg total) by mouth daily. Take first 2 tablets together, then 1 every day until finished.    Dispense:  6 tablet    Refill:  0    Order Specific Question:   Supervising Provider    Answer:   Merrilee Jansky [id number]    Decadron in office today Azithromycin prescribed Strep test negative, will send out for culture and we will call you with results Get plenty of rest and push fluids Take OTC Zyrtec and use chloraseptic spray as needed for throat pain. Drink warm or cool liquids, use throat lozenges, or popsicles to help alleviate symptoms Take OTC ibuprofen or tylenol as needed for pain Follow up with PCP if symptoms persists Return or go to ER if patient has any new or worsening symptoms such as fever, chills, nausea, vomiting, worsening sore throat, cough, abdominal pain, chest pain, changes in bowel or bladder habits  Covid swab obtained in office today.  Patient  instructed to quarantine until results are back and negative.  If results are negative, patient may resume daily schedule as tolerated once they are fever free for 24 hours without the use of antipyretic medications.  If results are positive, patient instructed to quarantine 10 days from today.  Patient instructed to follow-up with primary care with this office as needed.  Patient instructed to follow-up in the ER for trouble swallowing, trouble breathing, other concerning symptoms.  Reviewed expectations re: course of current medical issues. Questions answered. Outlined signs and symptoms indicating need for more acute intervention. Patient verbalized understanding. After Visit Summary given.          Moshe Cipro, NP 03/14/20 [phone]

## 2020-03-14 NOTE — Discharge Instructions (Addendum)
Your rapid strep test is negative.  A throat culture is pending; we will call you if it is positive requiring treatment.    Your COVID test is pending.  You should self quarantine until the test result is back.    Take Tylenol as needed for fever or discomfort.  Rest and keep yourself hydrated.    Go to the emergency department if you develop acute worsening symptoms.     

## 2020-03-16 LAB — NOVEL CORONAVIRUS, NAA: SARS-CoV-2, NAA: NOT DETECTED

## 2020-03-16 LAB — SARS-COV-2, NAA 2 DAY TAT

## 2020-03-18 LAB — CULTURE, GROUP A STREP (THRC)

## 2020-03-30 ENCOUNTER — Telehealth: Payer: Self-pay | Admitting: Family Medicine

## 2020-03-30 NOTE — Telephone Encounter (Signed)
Copied from CRM [id number]. Topic: General - Inquiry >> Mar 30, 2020  3:34 PM Adrian Prince D wrote: Reason for [id number] would like a call back from Dr. Yetta Barre or her nurse. He would like you to call him at [phone]. Please advise

## 2020-04-02 ENCOUNTER — Ambulatory Visit (INDEPENDENT_AMBULATORY_CARE_PROVIDER_SITE_OTHER): Payer: BC Managed Care – PPO | Admitting: Family Medicine

## 2020-04-02 ENCOUNTER — Other Ambulatory Visit: Payer: Self-pay

## 2020-04-02 ENCOUNTER — Encounter: Payer: Self-pay | Admitting: Family Medicine

## 2020-04-02 VITALS — BP 138/76 | HR 88 | Ht 68.0 in | Wt 228.0 lb

## 2020-04-02 DIAGNOSIS — M7712 Lateral epicondylitis, left elbow: Secondary | ICD-10-CM | POA: Diagnosis not present

## 2020-04-02 DIAGNOSIS — M2550 Pain in unspecified joint: Secondary | ICD-10-CM

## 2020-04-02 MED ORDER — MELOXICAM 15 MG PO TABS: 15.0000 mg | ORAL_TABLET | Freq: Every day | ORAL | 1 refills | Status: DC

## 2020-04-02 NOTE — Telephone Encounter (Signed)
Pt in office today

## 2020-04-02 NOTE — Patient Instructions (Signed)
Tennis Elbow  Tennis elbow (lateral epicondylitis) is inflammation of tendons in your outer forearm, near your elbow. Tendons are tissues that connect muscle to bone. When you have tennis elbow, inflammation affects the tendons that you use to bend your wrist and move your hand up. Inflammation occurs in the lower part of the upper arm bone (humerus), where the tendons connect to the bone (lateral epicondyle). Tennis elbow often affects people who play tennis, but anyone may get the condition from repeatedly extending the wrist or turning the forearm. What are the causes? This condition is usually caused by repeatedly extending the wrist, turning the forearm, and using the hands. It can result from sports or work that requires repetitive forearm movements. In some cases, it may be caused by a sudden injury. What increases the risk? You are more likely to develop tennis elbow if you play tennis or another racket sport. You also have a higher risk if you frequently use your hands for work. Besides people who play tennis, others at greater risk include:  Musicians.  Carpenters, painters, and plumbers.  Cooks.  Cashiers.  People who work in factories.  Construction workers.  Butchers.  People who use computers. What are the signs or symptoms? Symptoms of this condition include:  Pain and tenderness in the forearm and the outer part of the elbow. Pain may be felt only when using the arm, or it may be there all the time.  A burning feeling that starts in the elbow and spreads down the forearm.  A weak grip in the hand. How is this diagnosed? This condition may be diagnosed based on:  Your symptoms and medical history.  A physical exam.  X-rays.  MRI. How is this treated? Resting and icing your arm is often the first treatment. Your health care provider may also recommend:  Medicines to reduce pain and inflammation. These may be in the form of a pill, topical gels, or shots of a  steroid medicine (cortisone).  An elbow strap to reduce stress on the area.  Physical therapy. This may include massage or exercises.  An elbow brace to restrict the movements that cause symptoms. If these treatments do not help relieve your symptoms, your health care provider may recommend surgery to remove damaged muscle and reattach healthy muscle to bone. Follow these instructions at home: Activity  Rest your elbow and wrist and avoid activities that cause symptoms, as told by your health care provider.  Do physical therapy exercises as instructed.  If you lift an object, lift it with your palm facing up. This reduces stress on your elbow. Lifestyle  If your tennis elbow is caused by sports, check your equipment and make sure that: ? You are using it correctly. ? It is the best fit for you.  If your tennis elbow is caused by work or computer use, take frequent breaks to stretch your arm. Talk with your manager about ways to manage your condition at work. If you have a brace:  Wear the brace or strap as told by your health care provider. Remove it only as told by your health care provider.  Loosen the brace if your fingers tingle, become numb, or turn cold and blue.  Keep the brace clean.  If the brace is not waterproof, ask if you may remove it for bathing. If you must keep the brace on while bathing: ? Do not let it get wet. ? Cover it with a watertight covering when you take a   bath or a shower. General instructions   If directed, put ice on the painful area: ? Put ice in a plastic bag. ? Place a towel between your skin and the bag. ? Leave the ice on for 20 minutes, 2-3 times a day.  Take over-the-counter and prescription medicines only as told by your health care provider.  Keep all follow-up visits as told by your health care provider. This is important. Contact a health care provider if:  You have pain that gets worse or does not get better with  treatment.  You have numbness or weakness in your forearm, hand, or fingers. Summary  Tennis elbow (lateral epicondylitis) is inflammation of tendons in your outer forearm, near your elbow.  Common symptoms include pain and tenderness in your forearm and the outer part of your elbow.  This condition is usually caused by repeatedly extending your wrist, turning your forearm, and using your hands.  The first treatment is often resting and icing your arm to relieve symptoms. Further treatment may include taking medicine, getting physical therapy, wearing a brace or strap, or having surgery. This information is not intended to replace advice given to you by your health care provider. Make sure you discuss any questions you have with your health care provider. Document Revised: 04/02/2018 Document Reviewed: 04/21/2017 Elsevier Patient Education  2020 [employer]  

## 2020-04-02 NOTE — Progress Notes (Signed)
Date:  04/02/2020   Name:  Brendan Ray   DOB:  [birth year]   MRN:  [id number]   Chief Complaint: Hand Pain (index finger and R) wrist pain)  Hand Pain  The incident occurred more than 1 week ago (several weeks). There was no injury mechanism. The pain is present in the right hand and left elbow. The quality of the pain is described as aching. The pain has been intermittent since the incident. Pertinent negatives include no chest pain, muscle weakness, numbness or tingling. The symptoms are aggravated by palpation. He has tried NSAIDs for the symptoms. The treatment provided moderate relief.    Lab Results  Component Value Date   CREATININE 1.17 01/02/2020   BUN 12 01/02/2020   NA 137 01/02/2020   K 4.0 01/02/2020   CL 99 01/02/2020   CO2 23 01/02/2020   Lab Results  Component Value Date   CHOL 215 (H) 01/02/2020   HDL 39 (L) 01/02/2020   LDLCALC 133 (H) 01/02/2020   TRIG 237 (H) 01/02/2020   CHOLHDL 4.6 01/29/2017   No results found for: TSH No results found for: HGBA1C No results found for: WBC, HGB, HCT, MCV, PLT No results found for: ALT, AST, GGT, ALKPHOS, BILITOT   Review of Systems  Constitutional: Negative for chills and fever.  HENT: Negative for drooling, ear discharge, ear pain and sore throat.   Respiratory: Negative for cough, shortness of breath and wheezing.   Cardiovascular: Negative for chest pain, palpitations and leg swelling.  Gastrointestinal: Negative for abdominal pain, blood in stool, constipation, diarrhea and nausea.  Endocrine: Negative for polydipsia.  Genitourinary: Negative for dysuria, frequency, hematuria and urgency.  Musculoskeletal: Positive for arthralgias and joint swelling. Negative for back pain, myalgias and neck pain.  Skin: Negative for rash.  Allergic/Immunologic: Negative for environmental allergies.  Neurological: Negative for dizziness, tingling, numbness and headaches.  Hematological: Does not bruise/bleed easily.    Psychiatric/Behavioral: Negative for suicidal ideas. The patient is not nervous/anxious.     There are no problems to display for this patient.   No Known Allergies  Past Surgical History:  Procedure Laterality Date  . NASAL SINUS SURGERY      Social History   Tobacco Use  . Smoking status: Never Smoker  . Smokeless tobacco: Never Used  Vaping Use  . Vaping Use: Never used  Substance Use Topics  . Alcohol use: No    Alcohol/week: 0.0 standard drinks  . Drug use: No     Medication list has been reviewed and updated.  Current Meds  Medication Sig  . atorvastatin (LIPITOR) 10 MG tablet Take 1 tablet (10 mg total) by mouth daily.  Marland Kitchen lisinopril-hydrochlorothiazide (ZESTORETIC) 10-12.5 MG tablet Take 1 tablet by mouth daily.  . meloxicam (MOBIC) 15 MG tablet Take 1 tablet (15 mg total) by mouth daily as needed.    PHQ 2/9 Scores 01/02/2020 07/06/2018 11/22/2015  PHQ - 2 Score 0 0 0  PHQ- 9 Score 0 0 -    GAD 7 : Generalized Anxiety Score 01/02/2020  Nervous, Anxious, on Edge 0  Control/stop worrying 0  Worry too much - different things 0  Trouble relaxing 0  Restless 0  Easily annoyed or irritable 0  Afraid - awful might happen 0  Total GAD 7 Score 0    BP Readings from Last 3 Encounters:  04/02/20 138/76  03/14/20 124/77  01/25/20 140/79    Physical Exam Vitals and nursing note reviewed.  HENT:  Head: Normocephalic.     Right Ear: External ear normal.     Left Ear: External ear normal.     Nose: Nose normal.  Eyes:     General: No scleral icterus.       Right eye: No discharge.        Left eye: No discharge.     Conjunctiva/sclera: Conjunctivae normal.     Pupils: Pupils are equal, round, and reactive to light.  Neck:     Thyroid: No thyromegaly.     Vascular: No JVD.     Trachea: No tracheal deviation.  Cardiovascular:     Rate and Rhythm: Normal rate and regular rhythm.     Heart sounds: Normal heart sounds. No murmur heard.  No friction  rub. No gallop.   Pulmonary:     Effort: No respiratory distress.     Breath sounds: Normal breath sounds. No wheezing or rales.  Abdominal:     General: Bowel sounds are normal.     Palpations: Abdomen is soft. There is no mass.     Tenderness: There is no abdominal tenderness. There is no guarding or rebound.  Musculoskeletal:        General: Normal range of motion.     Left elbow: Normal range of motion. Tenderness present in lateral epicondyle.     Right wrist: Normal. No swelling or deformity. Normal range of motion.     Cervical back: Normal range of motion and neck supple.  Lymphadenopathy:     Cervical: No cervical adenopathy.  Skin:    General: Skin is warm.     Findings: No rash.  Neurological:     Mental Status: He is alert and oriented to person, place, and time.     Cranial Nerves: No cranial nerve deficit.     Deep Tendon Reflexes: Reflexes are normal and symmetric.     Wt Readings from Last 3 Encounters:  04/02/20 228 lb (103.4 kg)  01/25/20 233 lb 14.5 oz (106.1 kg)  01/02/20 234 lb (106.1 kg)    BP 138/76   Pulse 88   Ht 5\' 8"  (1.727 m)   Wt 228 lb (103.4 kg)   BMI 34.67 kg/m   Assessment and Plan: 1. Arthralgia, unspecified joint New onset.  Episodic.  Stable today.  Episode of pharyngitis last month after which she has had multiple episodes of swelling and joints in different areas particularly in the PIP and wrist most recently was swelling and tenderness of the right wrist on the weekend.  Patient took a large dose of ibuprofen (1000 mg) and there was resolution of the pain and swelling.  I suspect this may have been gout related and we will obtain a uric acid for evaluation.  In the meantime we will continue with Mobic 15 mg daily with Tylenol in between.  We have also check a rheumatoid factor and sed rate as well to evaluate for possible rheumatologic concerns. - meloxicam (MOBIC) 15 MG tablet; Take 1 tablet (15 mg total) by mouth daily.  Dispense: 30  tablet; Refill: 1 - Uric acid - Rheumatoid Factor - Sedimentation rate  2. Lateral epicondylitis of left elbow New onset.  Persistent.  Relatively stable.  Exam and location is consistent with lateral epicondylitis.  Patient will also be put on meloxicam 15 mg once a day.  If this does not resolve her next that will be orthopedic referral. - meloxicam (MOBIC) 15 MG tablet; Take 1 tablet (15 mg total) by mouth  daily.  Dispense: 30 tablet; Refill: 1

## 2020-04-03 LAB — SEDIMENTATION RATE: Sed Rate: 23 mm/hr (ref 0–30)

## 2020-04-03 LAB — RHEUMATOID FACTOR: Rheumatoid fact SerPl-aCnc: <10 IU/mL (ref 0.0–13.9)

## 2020-04-03 LAB — URIC ACID: Uric Acid: 5 mg/dL (ref 3.8–8.4)

## 2020-04-10 ENCOUNTER — Ambulatory Visit: Payer: BC Managed Care – PPO | Admitting: Family Medicine

## 2020-04-11 ENCOUNTER — Ambulatory Visit: Payer: BC Managed Care – PPO | Admitting: Family Medicine

## 2020-04-19 ENCOUNTER — Encounter: Payer: Self-pay | Admitting: Family Medicine

## 2020-04-19 ENCOUNTER — Other Ambulatory Visit: Payer: Self-pay

## 2020-04-19 ENCOUNTER — Ambulatory Visit (INDEPENDENT_AMBULATORY_CARE_PROVIDER_SITE_OTHER): Payer: BC Managed Care – PPO | Admitting: Family Medicine

## 2020-04-19 VITALS — BP 130/80 | HR 72 | Ht 68.0 in | Wt 229.0 lb

## 2020-04-19 DIAGNOSIS — M2550 Pain in unspecified joint: Secondary | ICD-10-CM

## 2020-04-19 MED ORDER — PREDNISONE 10 MG PO TABS: 10.0000 mg | ORAL_TABLET | Freq: Every day | ORAL | 0 refills | Status: DC

## 2020-04-19 NOTE — Progress Notes (Signed)
Date:  04/19/2020   Name:  Brendan Ray   DOB:  [birth year]   MRN:  [id number]   Chief Complaint: Hand Pain (had 4 "good days" on meloxicam- started after that with L) arm pain and subsided around 5:00 in the afternoon. Sunday pain returned in arm and fingers. Monday hurting more toward shoulder then Tuesday on upper arm.)  Hand Pain  The incident occurred more than 1 week ago. There was no injury mechanism. The pain is present in the left hand and left shoulder. The quality of the pain is described as aching. Pertinent negatives include no chest pain.    Lab Results  Component Value Date   CREATININE 1.17 01/02/2020   BUN 12 01/02/2020   NA 137 01/02/2020   K 4.0 01/02/2020   CL 99 01/02/2020   CO2 23 01/02/2020   Lab Results  Component Value Date   CHOL 215 (H) 01/02/2020   HDL 39 (L) 01/02/2020   LDLCALC 133 (H) 01/02/2020   TRIG 237 (H) 01/02/2020   CHOLHDL 4.6 01/29/2017   No results found for: TSH No results found for: HGBA1C No results found for: WBC, HGB, HCT, MCV, PLT No results found for: ALT, AST, GGT, ALKPHOS, BILITOT   Review of Systems  Constitutional: Negative for chills and fever.  HENT: Negative for drooling, ear discharge, ear pain and sore throat.   Respiratory: Negative for cough, shortness of breath and wheezing.   Cardiovascular: Negative for chest pain, palpitations and leg swelling.  Gastrointestinal: Negative for abdominal pain, blood in stool, constipation, diarrhea and nausea.  Endocrine: Negative for polydipsia.  Genitourinary: Negative for dysuria, frequency, hematuria and urgency.  Musculoskeletal: Negative for back pain, myalgias and neck pain.  Skin: Negative for rash.  Allergic/Immunologic: Negative for environmental allergies.  Neurological: Negative for dizziness and headaches.  Hematological: Does not bruise/bleed easily.  Psychiatric/Behavioral: Negative for suicidal ideas. The patient is not nervous/anxious.     There are no  problems to display for this patient.   No Known Allergies  Past Surgical History:  Procedure Laterality Date  . NASAL SINUS SURGERY      Social History   Tobacco Use  . Smoking status: Never Smoker  . Smokeless tobacco: Never Used  Vaping Use  . Vaping Use: Never used  Substance Use Topics  . Alcohol use: No    Alcohol/week: 0.0 standard drinks  . Drug use: No     Medication list has been reviewed and updated.  Current Meds  Medication Sig  . atorvastatin (LIPITOR) 10 MG tablet Take 1 tablet (10 mg total) by mouth daily.  Marland Kitchen lisinopril-hydrochlorothiazide (ZESTORETIC) 10-12.5 MG tablet Take 1 tablet by mouth daily.  . meloxicam (MOBIC) 15 MG tablet Take 1 tablet (15 mg total) by mouth daily.    PHQ 2/9 Scores 01/02/2020 07/06/2018 11/22/2015  PHQ - 2 Score 0 0 0  PHQ- 9 Score 0 0 -    GAD 7 : Generalized Anxiety Score 01/02/2020  Nervous, Anxious, on Edge 0  Control/stop worrying 0  Worry too much - different things 0  Trouble relaxing 0  Restless 0  Easily annoyed or irritable 0  Afraid - awful might happen 0  Total GAD 7 Score 0    BP Readings from Last 3 Encounters:  04/19/20 130/80  04/02/20 138/76  03/14/20 124/77    Physical Exam Vitals and nursing note reviewed.  HENT:     Head: Normocephalic.     Right Ear: External  ear normal.     Left Ear: External ear normal.     Nose: Nose normal.  Eyes:     General: No scleral icterus.       Right eye: No discharge.        Left eye: No discharge.     Conjunctiva/sclera: Conjunctivae normal.     Pupils: Pupils are equal, round, and reactive to light.  Neck:     Thyroid: No thyromegaly.     Vascular: No JVD.     Trachea: No tracheal deviation.  Cardiovascular:     Rate and Rhythm: Normal rate and regular rhythm.     Heart sounds: Normal heart sounds. No murmur heard.  No friction rub. No gallop.   Pulmonary:     Effort: No respiratory distress.     Breath sounds: Normal breath sounds. No wheezing,  rhonchi or rales.  Abdominal:     General: Bowel sounds are normal.     Palpations: Abdomen is soft. There is no mass.     Tenderness: There is no abdominal tenderness. There is no guarding or rebound.  Musculoskeletal:        General: No swelling or tenderness. Normal range of motion.     Right hand: No swelling, deformity, tenderness or bony tenderness. Normal range of motion.     Left hand: No swelling, deformity, tenderness or bony tenderness. Normal range of motion.     Cervical back: Normal range of motion and neck supple.     Left lower leg: No edema.  Lymphadenopathy:     Cervical: No cervical adenopathy.  Skin:    General: Skin is warm.     Findings: No rash.  Neurological:     Mental Status: He is alert and oriented to person, place, and time.     Cranial Nerves: No cranial nerve deficit.     Deep Tendon Reflexes: Reflexes are normal and symmetric.     Wt Readings from Last 3 Encounters:  04/19/20 229 lb (103.9 kg)  04/02/20 228 lb (103.4 kg)  01/25/20 233 lb 14.5 oz (106.1 kg)    BP 130/80   Pulse 72   Ht 5\' 8"  (1.727 m)   Wt 229 lb (103.9 kg)   BMI 34.82 kg/m   Assessment and Plan: 1. Arthralgia, unspecified joint New onset.  Persistent.  Episodic.  Almost a migratory type arthritis which waxes and wanes in different areas but particularly involving the hands.  Patient is currently on meloxicam 15 mg once a day and we will continue this as well as a prednisone taper just to get a jump start so patient can have some relief from the pain at night.  We reviewed the lab work for rheumatoid concerns as well as uric acid we will obtain a Lyme titer with a reflex of the Western blot if necessary.  Orthopedic consult was placed for patient given the persistent nature of the concern as well as the significant pain when it does occur. - Lyme Ab/Western Blot Reflex - predniSONE (DELTASONE) 10 MG tablet; Take 1 tablet (10 mg total) by mouth daily with breakfast.  4,4,4,3,3,3,2,2,2,1,1,1  Dispense: 30 tablet; Refill: 0 - Ambulatory referral to Orthopedic Surgery

## 2020-04-20 LAB — LYME AB/WESTERN BLOT REFLEX
LYME DISEASE AB, QUANT, IGM: <0.8 index (ref 0.00–0.79)
Lyme IgG/IgM Ab: <0.91 {ISR} (ref 0.00–0.90)

## 2020-04-25 DIAGNOSIS — M2550 Pain in unspecified joint: Secondary | ICD-10-CM | POA: Diagnosis not present

## 2020-04-25 DIAGNOSIS — E669 Obesity, unspecified: Secondary | ICD-10-CM | POA: Diagnosis not present

## 2020-05-16 ENCOUNTER — Telehealth: Payer: Self-pay | Admitting: Family Medicine

## 2020-05-16 NOTE — Telephone Encounter (Signed)
Called pt and he will call back on Friday- we will see about calling in pred for a couple of days if not better

## 2020-05-16 NOTE — Telephone Encounter (Signed)
Pt is still experiencing pain and has an appt with someone for his arthritis on November 9th but is not able to go through with pain until then and is asking for a refill for predniSONE (DELTASONE) 10 MG tablet / please advise

## 2020-05-29 DIAGNOSIS — M0609 Rheumatoid arthritis without rheumatoid factor, multiple sites: Secondary | ICD-10-CM | POA: Diagnosis not present

## 2020-05-29 DIAGNOSIS — Z79899 Other long term (current) drug therapy: Secondary | ICD-10-CM | POA: Diagnosis not present

## 2020-05-29 DIAGNOSIS — M0689 Other specified rheumatoid arthritis, multiple sites: Secondary | ICD-10-CM | POA: Diagnosis not present

## 2020-05-29 DIAGNOSIS — Z111 Encounter for screening for respiratory tuberculosis: Secondary | ICD-10-CM | POA: Diagnosis not present

## 2020-05-29 DIAGNOSIS — M7989 Other specified soft tissue disorders: Secondary | ICD-10-CM | POA: Diagnosis not present

## 2020-05-30 ENCOUNTER — Telehealth: Payer: Self-pay

## 2020-05-30 NOTE — Telephone Encounter (Signed)
Spoke to pt- explained to him that we have several patients that are on methotrexate and are doing great on the medicine. Also told him that once a specialist takes over, he needs to do what they are advising. Dr Yetta Barre does not override what they advise the patient to do.

## 2020-05-30 NOTE — Telephone Encounter (Signed)
Copied from CRM [id number]. Topic: Quick Communication - See Telephone Encounter >> May 30, 2020  9:53 AM Aretta Nip wrote: CRM for notification. See Telephone encounter for: 05/30/20. PT cb [phone]  [facility] was going to start  pt  on Methotrexate , pt wants an opinion frm Dr Yetta Barre if she thinks this is a good idea for him to start this med. FU when convenient

## 2020-06-20 DIAGNOSIS — M0609 Rheumatoid arthritis without rheumatoid factor, multiple sites: Secondary | ICD-10-CM | POA: Diagnosis not present

## 2020-06-20 DIAGNOSIS — Z79899 Other long term (current) drug therapy: Secondary | ICD-10-CM | POA: Diagnosis not present

## 2020-06-21 ENCOUNTER — Encounter: Payer: Self-pay | Admitting: Family Medicine

## 2020-07-03 ENCOUNTER — Encounter: Payer: Self-pay | Admitting: Family Medicine

## 2020-07-05 ENCOUNTER — Encounter: Payer: Self-pay | Admitting: Family Medicine

## 2020-09-07 ENCOUNTER — Other Ambulatory Visit: Payer: Self-pay | Admitting: Family Medicine

## 2020-09-07 DIAGNOSIS — I10 Essential (primary) hypertension: Secondary | ICD-10-CM

## 2020-10-05 ENCOUNTER — Other Ambulatory Visit: Payer: Self-pay | Admitting: Family Medicine

## 2020-10-05 DIAGNOSIS — I10 Essential (primary) hypertension: Secondary | ICD-10-CM

## 2020-10-15 ENCOUNTER — Other Ambulatory Visit: Payer: Self-pay

## 2020-10-15 ENCOUNTER — Ambulatory Visit: Payer: BC Managed Care – PPO | Admitting: Family Medicine

## 2020-10-15 ENCOUNTER — Encounter: Payer: Self-pay | Admitting: Family Medicine

## 2020-10-15 VITALS — BP 132/82 | HR 84 | Ht 68.0 in | Wt 230.0 lb

## 2020-10-15 DIAGNOSIS — E782 Mixed hyperlipidemia: Secondary | ICD-10-CM

## 2020-10-15 DIAGNOSIS — I10 Essential (primary) hypertension: Secondary | ICD-10-CM | POA: Diagnosis not present

## 2020-10-15 DIAGNOSIS — F329 Major depressive disorder, single episode, unspecified: Secondary | ICD-10-CM

## 2020-10-15 MED ORDER — LISINOPRIL-HYDROCHLOROTHIAZIDE 10-12.5 MG PO TABS: ORAL_TABLET | ORAL | 1 refills | Status: DC

## 2020-10-15 MED ORDER — DULOXETINE HCL 30 MG PO CPEP: 30.0000 mg | ORAL_CAPSULE | Freq: Every day | ORAL | 1 refills | Status: DC

## 2020-10-15 MED ORDER — ATORVASTATIN CALCIUM 10 MG PO TABS: 10.0000 mg | ORAL_TABLET | Freq: Every day | ORAL | 1 refills | Status: DC

## 2020-10-15 NOTE — Progress Notes (Signed)
Date:  10/15/2020   Name:  Brendan Ray   DOB:  02/16/1966   MRN:  [id number]   Chief Complaint: Hypertension and Hyperlipidemia  Hypertension This is a chronic problem. The current episode started more than 1 year ago. The problem has been gradually improving since onset. The problem is controlled. Pertinent negatives include no anxiety, blurred vision, chest pain, headaches, malaise/fatigue, neck pain, orthopnea, palpitations, peripheral edema, PND, shortness of breath or sweats. There are no associated agents to hypertension. Risk factors for coronary artery disease include dyslipidemia. Past treatments include ACE inhibitors and diuretics. The current treatment provides moderate improvement. There are no compliance problems.  There is no history of angina, kidney disease, CAD/MI, CVA, heart failure, left ventricular hypertrophy, PVD or retinopathy. There is no history of chronic renal disease, a hypertension causing med or renovascular disease.  Hyperlipidemia This is a chronic problem. The current episode started more than 1 year ago. The problem is controlled. Recent lipid tests were reviewed and are normal. He has no history of chronic renal disease, diabetes, hypothyroidism, liver disease, obesity or nephrotic syndrome. Factors aggravating his hyperlipidemia include thiazides. Pertinent negatives include no chest pain, focal sensory loss, focal weakness, leg pain, myalgias or shortness of breath. Current antihyperlipidemic treatment includes statins. The current treatment provides moderate improvement of lipids. There are no compliance problems.  Risk factors for coronary artery disease include post-menopausal.  Depression        This is a chronic problem.  The current episode started more than 1 year ago.   The onset quality is gradual.   The problem has been waxing and waning since onset.  Associated symptoms include fatigue, irritable, body aches and sad.  Associated symptoms include no  decreased concentration, no helplessness, no hopelessness, does not have insomnia, no restlessness, no decreased interest, no appetite change, no myalgias, no headaches, no indigestion and no suicidal ideas.     The symptoms are aggravated by nothing.  Past treatments include nothing.  Compliance with treatment is variable.  Previous treatment provided moderate relief.   Pertinent negatives include no hypothyroidism and no anxiety.  (rheumatoid)   Lab Results  Component Value Date   CREATININE 1.17 01/02/2020   BUN 12 01/02/2020   NA 137 01/02/2020   K 4.0 01/02/2020   CL 99 01/02/2020   CO2 23 01/02/2020   Lab Results  Component Value Date   CHOL 215 (H) 01/02/2020   HDL 39 (L) 01/02/2020   LDLCALC 133 (H) 01/02/2020   TRIG 237 (H) 01/02/2020   CHOLHDL 4.6 01/29/2017   No results found for: TSH No results found for: HGBA1C No results found for: WBC, HGB, HCT, MCV, PLT No results found for: ALT, AST, GGT, ALKPHOS, BILITOT   Review of Systems  Constitutional: Positive for fatigue. Negative for appetite change, chills, fever and malaise/fatigue.  HENT: Negative for drooling, ear discharge, ear pain and sore throat.   Eyes: Negative for blurred vision.  Respiratory: Negative for cough, shortness of breath and wheezing.   Cardiovascular: Negative for chest pain, palpitations, orthopnea, leg swelling and PND.  Gastrointestinal: Negative for abdominal pain, blood in stool, constipation, diarrhea and nausea.  Endocrine: Negative for polydipsia.  Genitourinary: Negative for dysuria, frequency, hematuria and urgency.  Musculoskeletal: Negative for back pain, myalgias and neck pain.  Skin: Negative for rash.  Allergic/Immunologic: Negative for environmental allergies.  Neurological: Negative for dizziness, focal weakness and headaches.  Hematological: Does not bruise/bleed easily.  Psychiatric/Behavioral: Positive for depression.  Negative for decreased concentration and suicidal ideas.  The patient is not nervous/anxious and does not have insomnia.     There are no problems to display for this patient.   No Known Allergies  Past Surgical History:  Procedure Laterality Date  . NASAL SINUS SURGERY      Social History   Tobacco Use  . Smoking status: Never Smoker  . Smokeless tobacco: Never Used  Vaping Use  . Vaping Use: Never used  Substance Use Topics  . Alcohol use: No    Alcohol/week: 0.0 standard drinks  . Drug use: No     Medication list has been reviewed and updated.  Current Meds  Medication Sig  . atorvastatin (LIPITOR) 10 MG tablet Take 1 tablet (10 mg total) by mouth daily.  . folic acid (FOLVITE) 1 MG tablet Take 1 mg by mouth daily. Rheumatology  . lisinopril-hydrochlorothiazide (ZESTORETIC) 10-12.5 MG tablet TAKE 1 TABLET BY MOUTH EVERY DAY *NEEDS APPT  . methotrexate (RHEUMATREX) 2.5 MG tablet Take by mouth. Patel  . [DISCONTINUED] meloxicam (MOBIC) 15 MG tablet Take 1 tablet (15 mg total) by mouth daily.    PHQ 2/9 Scores 10/15/2020 01/02/2020 07/06/2018 11/22/2015  PHQ - 2 Score 1 0 0 0  PHQ- 9 Score 4 0 0 -    GAD 7 : Generalized Anxiety Score 10/15/2020 01/02/2020  Nervous, Anxious, on Edge 1 0  Control/stop worrying 0 0  Worry too much - different things 0 0  Trouble relaxing 0 0  Restless 0 0  Easily annoyed or irritable 1 0  Afraid - awful might happen 0 0  Total GAD 7 Score 2 0  Anxiety Difficulty Not difficult at all -    BP Readings from Last 3 Encounters:  10/15/20 132/82  04/19/20 130/80  04/02/20 138/76    Physical Exam Vitals and nursing note reviewed.  Constitutional:      General: He is irritable.  HENT:     Head: Normocephalic.     Right Ear: Tympanic membrane, ear canal and external ear normal.     Left Ear: Tympanic membrane, ear canal and external ear normal.     Nose: Nose normal. No congestion or rhinorrhea.     Mouth/Throat:     Mouth: Mucous membranes are moist.  Eyes:     General: No scleral  icterus.       Right eye: No discharge.        Left eye: No discharge.     Conjunctiva/sclera: Conjunctivae normal.     Pupils: Pupils are equal, round, and reactive to light.  Neck:     Thyroid: No thyromegaly.     Vascular: No JVD.     Trachea: No tracheal deviation.  Cardiovascular:     Rate and Rhythm: Normal rate and regular rhythm.     Heart sounds: Normal heart sounds, S1 normal and S2 normal. No murmur heard.  No systolic murmur is present.  No diastolic murmur is present. No friction rub. No gallop. No S3 or S4 sounds.   Pulmonary:     Effort: No respiratory distress.     Breath sounds: Normal breath sounds. No decreased breath sounds, wheezing, rhonchi or rales.  Abdominal:     General: Bowel sounds are normal.     Palpations: Abdomen is soft. There is no mass.     Tenderness: There is no abdominal tenderness. There is no guarding or rebound.  Musculoskeletal:        General: No tenderness.  Normal range of motion.     Cervical back: Normal range of motion and neck supple.  Lymphadenopathy:     Cervical: No cervical adenopathy.  Skin:    General: Skin is warm.     Findings: No rash.  Neurological:     Mental Status: He is alert and oriented to person, place, and time.     Cranial Nerves: No cranial nerve deficit.     Deep Tendon Reflexes: Reflexes are normal and symmetric.     Wt Readings from Last 3 Encounters:  10/15/20 230 lb (104.3 kg)  04/19/20 229 lb (103.9 kg)  04/02/20 228 lb (103.4 kg)    BP 132/82   Pulse 84   Ht 5\' 8"  (1.727 m)   Wt 230 lb (104.3 kg)   BMI 34.97 kg/m   Assessment and Plan: 1. Hyperlipidemia, mixed Chronic.  Controlled.  Stable.  Continue atorvastatin 10 mg once a day.  Will check lipid panel for current LDL status. - atorvastatin (LIPITOR) 10 MG tablet; Take 1 tablet (10 mg total) by mouth daily.  Dispense: 90 tablet; Refill: 1 - Lipid Panel With LDL/HDL Ratio  2. Essential hypertension Chronic.  Controlled.  Stable.  Blood  pressure 132/82.  Continue lisinopril hydrochlorothiazide 10-12.5 mg once a day.  Will check CMP. - lisinopril-hydrochlorothiazide (ZESTORETIC) 10-12.5 MG tablet; TAKE 1 TABLET BY MOUTH EVERY DAY  Dispense: 90 tablet; Refill: 1 - Comprehensive Metabolic Panel (CMET)  3. Reactive depression Relatively new onset but been persistent symptomatology for 6 to 8 months.  PHQ is 4 Gad score is 2.  Patient is currently being treated by rheumatology for rheumatoid arthritis and still has some breakthrough pain therefore I think we will start him on duloxetine 30 mg both for the depression and pain control.  We will recheck patient in 8 weeks at which time we will redo his pain assessment as well as depression scores. - DULoxetine (CYMBALTA) 30 MG capsule; Take 1 capsule (30 mg total) by mouth daily.  Dispense: 690 capsule; Refill: 1

## 2020-10-16 LAB — COMPREHENSIVE METABOLIC PANEL
ALT: 77 IU/L — ABNORMAL HIGH (ref 0–44)
AST: 53 IU/L — ABNORMAL HIGH (ref 0–40)
Albumin/Globulin Ratio: 1.5 (ref 1.2–2.2)
Albumin: 4.1 g/dL (ref 3.8–4.9)
Alkaline Phosphatase: 82 IU/L (ref 44–121)
BUN/Creatinine Ratio: 9 (ref 9–20)
BUN: 9 mg/dL (ref 6–24)
Bilirubin Total: 0.5 mg/dL (ref 0.0–1.2)
CO2: 21 mmol/L (ref 20–29)
Calcium: 9.5 mg/dL (ref 8.7–10.2)
Chloride: 103 mmol/L (ref 96–106)
Creatinine, Ser: 1.04 mg/dL (ref 0.76–1.27)
Globulin, Total: 2.7 g/dL (ref 1.5–4.5)
Glucose: 102 mg/dL — ABNORMAL HIGH (ref 65–99)
Potassium: 3.9 mmol/L (ref 3.5–5.2)
Sodium: 139 mmol/L (ref 134–144)
Total Protein: 6.8 g/dL (ref 6.0–8.5)
eGFR: 85 mL/min/{1.73_m2} (ref >59)

## 2020-10-16 LAB — LIPID PANEL WITH LDL/HDL RATIO
Cholesterol, Total: 184 mg/dL (ref 100–199)
HDL: 42 mg/dL (ref >39)
LDL Chol Calc (NIH): 116 mg/dL — ABNORMAL HIGH (ref 0–99)
LDL/HDL Ratio: 2.8 ratio (ref 0.0–3.6)
Triglycerides: 148 mg/dL (ref 0–149)
VLDL Cholesterol Cal: 26 mg/dL (ref 5–40)

## 2020-10-31 ENCOUNTER — Telehealth: Payer: Self-pay

## 2020-10-31 NOTE — Telephone Encounter (Signed)
It was a repeat of LFTs- didn't need appt, walk in lab

## 2020-10-31 NOTE — Telephone Encounter (Unsigned)
Copied from CRM [id number]. Topic: Appointment Scheduling - Scheduling Inquiry for Clinic >> Oct 31, 2020  9:36 AM Elliot Gault wrote: Patient was advised to follow up in 2 weeks from last appointment due to most recent lab results (chart does not reflect lab orders). Best call back # [phone]

## 2020-11-01 NOTE — Telephone Encounter (Signed)
Spoke to pt, will try and come by today 11/01/20

## 2020-11-06 DIAGNOSIS — R5383 Other fatigue: Secondary | ICD-10-CM | POA: Diagnosis not present

## 2020-11-06 DIAGNOSIS — M0609 Rheumatoid arthritis without rheumatoid factor, multiple sites: Secondary | ICD-10-CM | POA: Diagnosis not present

## 2020-11-06 DIAGNOSIS — R7989 Other specified abnormal findings of blood chemistry: Secondary | ICD-10-CM | POA: Diagnosis not present

## 2020-11-06 DIAGNOSIS — Z79899 Other long term (current) drug therapy: Secondary | ICD-10-CM | POA: Diagnosis not present

## 2020-12-10 ENCOUNTER — Ambulatory Visit: Payer: Self-pay | Admitting: Family Medicine

## 2021-01-24 ENCOUNTER — Ambulatory Visit: Admit: 2021-01-24 | Payer: Self-pay

## 2021-01-24 ENCOUNTER — Encounter: Payer: Self-pay | Admitting: Emergency Medicine

## 2021-01-24 ENCOUNTER — Other Ambulatory Visit: Payer: Self-pay

## 2021-01-24 ENCOUNTER — Ambulatory Visit
Admission: EM | Admit: 2021-01-24 | Discharge: 2021-01-24 | Disposition: A | Discharge status: Home / Self Care | Payer: BC Managed Care – PPO | Attending: Sports Medicine | Admitting: Sports Medicine

## 2021-01-24 DIAGNOSIS — R059 Cough, unspecified: Secondary | ICD-10-CM

## 2021-01-24 DIAGNOSIS — R0602 Shortness of breath: Secondary | ICD-10-CM

## 2021-01-24 DIAGNOSIS — U071 COVID-19: Secondary | ICD-10-CM

## 2021-01-24 DIAGNOSIS — R509 Fever, unspecified: Secondary | ICD-10-CM

## 2021-01-24 DIAGNOSIS — R61 Generalized hyperhidrosis: Secondary | ICD-10-CM

## 2021-01-24 MED ORDER — PROMETHAZINE-DM 6.25-15 MG/5ML PO SYRP: 5.0000 mL | ORAL_SOLUTION | Freq: Four times a day (QID) | ORAL | 0 refills | Status: DC | PRN

## 2021-01-24 MED ORDER — MOLNUPIRAVIR EUA 200MG CAPSULE: 4.0000 | ORAL_CAPSULE | Freq: Two times a day (BID) | ORAL | 0 refills | Status: AC

## 2021-01-24 MED ORDER — ALBUTEROL SULFATE HFA 108 (90 BASE) MCG/ACT IN AERS: 1.0000 | INHALATION_SPRAY | Freq: Four times a day (QID) | RESPIRATORY_TRACT | 0 refills | Status: DC | PRN

## 2021-01-24 MED ORDER — FLUTICASONE PROPIONATE 50 MCG/ACT NA SUSP: 2.0000 | Freq: Every day | NASAL | 0 refills | Status: AC

## 2021-01-24 NOTE — Discharge Instructions (Addendum)
As we discussed, given your persistent symptoms with COVID I am going to treat you fairly aggressively. I am giving you a oral COVID medication that you will take as prescribed.  Please call your rheumatologist and let them know that you are having persistent symptoms.  It may be related to the fact that you are on Humira and your immune system is diminished a little bit. Given your cough I gave you a cough medicine. Given your nasal congestion I gave you a nasal steroid. Also with your cough I have prescribed an albuterol inhaler to help open up your lower bronchioles and help you cough some of that stuff up. You can also pick up Mucinex over-the-counter 1200 mg twice a day without the DM component. Use Tylenol, Motrin, ibuprofen for any fever shakes chills. Please see educational handouts. If your symptoms persist please contact Dr. Barnett Applebaum office. Certainly if your symptoms were to worsen in any way then please go to the emergency room for a higher level of care.

## 2021-01-24 NOTE — ED Triage Notes (Signed)
COVID positive; shortness of breath and cough for 1.5 weeks. Temp 99.8, last had ibuprofen at 1700

## 2021-01-24 NOTE — ED Provider Notes (Signed)
[facility]    CSN: [id number] Arrival date & time: 01/24/21  1956      History   Chief Complaint Chief Complaint  Patient presents with   Cough   Shortness of Breath    HPI Brendan Ray is a 55 y.o. male.   Patient is a pleasant 55 year old male who presents for evaluation of URI symptoms for about 10 days now.  He sees Dr. Yetta BarreJones for ongoing medical care but has not been seen in the office for this.  He is self-employed as a Emergency planning/management officerconstruction contractor.  He reports URI symptoms including cough, fevers, nasal congestion, postnasal drip, and more recently a little bit of shortness of breath with activity.  He had a positive home COVID test 8 days ago.  Since then he has had intermittent fevers that have responded well to fever reducing medication.  He says every time he thinks that he is getting over it it comes straight back.  He denies any history of asthma.  No wheeze.  His cough is mildly productive.  He has had some mild nausea and some mild loose stools but no vomiting or diarrhea.  No urinary symptoms.  No significant abdominal pain.  No chest pain.  Complicating his situation is he is on Humira for rheumatoid arthritis.  No significant red flag signs or symptoms elicited on history.  Of note he was 99.8 upon arrival and he had taken ibuprofen about 3 hours prior to arrival.   Past Medical History:  Diagnosis Date   Hypertension     There are no problems to display for this patient.   Past Surgical History:  Procedure Laterality Date   NASAL SINUS SURGERY         Home Medications    Prior to Admission medications   Medication Sig Start Date End Date Taking? Authorizing Provider  Adalimumab (HUMIRA) 20 MG/0.2ML PSKT Inject into the skin. DOSE UNKNOWN   Yes [provider]  albuterol (VENTOLIN HFA) 108 (90 Base) MCG/ACT inhaler Inhale 1-2 puffs into the lungs every 6 (six) hours as needed for wheezing or shortness of breath. 01/24/21  Yes Delton SeeBarnes,  Adeoluwa Silvers, MD  fluticasone [facility](FLONASE) 50 MCG/ACT nasal spray Place 2 sprays into both nostrils daily. 01/24/21  Yes Delton SeeBarnes, Dymond Gutt, MD  molnupiravir EUA 200 mg CAPS Take 4 capsules (800 mg total) by mouth 2 (two) times daily for 5 days. 01/24/21 01/29/21 Yes Delton SeeBarnes, Fawne Hughley, MD  promethazine-dextromethorphan (PROMETHAZINE-DM) 6.25-15 MG/5ML syrup Take 5 mLs by mouth 4 (four) times daily as needed for cough. 01/24/21  Yes Delton SeeBarnes, Lanyla Costello, MD  atorvastatin (LIPITOR) 10 MG tablet Take 1 tablet (10 mg total) by mouth daily. 10/15/20   Duanne LimerickJones, Deanna C, MD  DULoxetine (CYMBALTA) 30 MG capsule Take 1 capsule (30 mg total) by mouth daily. 10/15/20   Duanne LimerickJones, Deanna C, MD  folic acid (FOLVITE) 1 MG tablet Take 1 mg by mouth daily. Rheumatology 08/24/20   [provider]  lisinopril-hydrochlorothiazide (ZESTORETIC) 10-12.5 MG tablet TAKE 1 TABLET BY MOUTH EVERY DAY 10/15/20   Duanne LimerickJones, Deanna C, MD  methotrexate (RHEUMATREX) 2.5 MG tablet Take by mouth. Patel 10/11/20   [provider]  predniSONE (DELTASONE) 10 MG tablet Take 1 tablet (10 mg total) by mouth daily with breakfast. 4,4,4,3,3,3,2,2,2,1,1,1 Patient not taking: Reported on 10/15/2020 04/19/20   Duanne LimerickJones, Deanna C, MD    Family History Family History  Problem Relation Age of Onset   Healthy Mother    Healthy Father  Social History Social History   Tobacco Use   Smoking status: Never   Smokeless tobacco: Never  Vaping Use   Vaping Use: Never used  Substance Use Topics   Alcohol use: No    Alcohol/week: 0.0 standard drinks   Drug use: No     Allergies   Patient has no known allergies.   Review of Systems Review of Systems  Constitutional:  Positive for activity change, chills, diaphoresis, fatigue and fever. Negative for appetite change.  HENT:  Positive for congestion and postnasal drip. Negative for ear pain, rhinorrhea, sinus pressure, sinus pain, sneezing and sore throat.   Eyes:  Negative for pain.  Respiratory:  Positive  for shortness of breath. Negative for cough, chest tightness and wheezing.   Cardiovascular:  Negative for chest pain, palpitations and leg swelling.  Gastrointestinal:  Negative for abdominal pain, diarrhea, nausea and vomiting.  Genitourinary:  Negative for dysuria.  Musculoskeletal:  Negative for back pain, myalgias and neck pain.  Skin:  Negative for color change, pallor, rash and wound.  Neurological:  Negative for dizziness, seizures, syncope, weakness, light-headedness, numbness and headaches.  All other systems reviewed and are negative.   Physical Exam Triage Vital Signs ED Triage Vitals [01/24/21 2004]  Enc Vitals Group     BP (!) 155/80     Pulse Rate 92     Resp 20     Temp 99.8 F (37.7 C)     Temp Source Oral     SpO2 98 %     Weight      Height      Head Circumference      Peak Flow      Pain Score 0     Pain Loc      Pain Edu?      Excl. in GC?    No data found.  Updated Vital Signs BP (!) 155/80   Pulse 92   Temp 99.8 F (37.7 C) (Oral)   Resp 20   SpO2 98%   Visual Acuity Right Eye Distance:   Left Eye Distance:   Bilateral Distance:    Right Eye Near:   Left Eye Near:    Bilateral Near:     Physical Exam Vitals and nursing note reviewed.  Constitutional:      General: He is not in acute distress.    Appearance: Normal appearance. He is well-developed. He is not ill-appearing, toxic-appearing or diaphoretic.  HENT:     Head: Normocephalic and atraumatic.     Nose: Nose normal.     Mouth/Throat:     Mouth: Mucous membranes are moist.     Pharynx: No pharyngeal swelling or oropharyngeal exudate.  Eyes:     Extraocular Movements: Extraocular movements intact.     Conjunctiva/sclera: Conjunctivae normal.     Pupils: Pupils are equal, round, and reactive to light.  Neck:     Vascular: No JVD.     Trachea: No tracheal deviation.  Cardiovascular:     Rate and Rhythm: Normal rate and regular rhythm. No extrasystoles are present.     Pulses: Normal pulses. No decreased pulses.     Heart sounds: Normal heart sounds. No murmur heard.   No friction rub. No gallop.  Pulmonary:     Effort: Pulmonary effort is normal. No respiratory distress.     Breath sounds: Normal breath sounds. No stridor. No decreased breath sounds, wheezing, rhonchi or rales.  Musculoskeletal:        General:  Normal range of motion.     Cervical back: Normal range of motion and neck supple. No rigidity or tenderness.     Right lower leg: No edema.     Left lower leg: No edema.  Lymphadenopathy:     Cervical: No cervical adenopathy.  Skin:    General: Skin is warm and dry.     Capillary Refill: Capillary refill takes less than 2 seconds.     Findings: No ecchymosis, erythema or rash.  Neurological:     General: No focal deficit present.     Mental Status: He is alert and oriented to person, place, and time.     UC Treatments / Results  Labs (all labs ordered are listed, but only abnormal results are displayed) Labs Reviewed - No data to display  EKG   Radiology No results found.  Procedures Procedures (including critical care time)  Medications Ordered in UC Medications - No data to display  Initial Impression / Assessment and Plan / UC Course  I have reviewed the triage vital signs and the nursing notes.  Pertinent labs & imaging results that were available during my care of the patient were reviewed by me and considered in my medical decision making (see chart for details).  Clinical impression: 1.  Positive home COVID test still symptomatic 2.  Continuous fevers 3.  Cough 4.  Shortness of breath with exertion 5.  Diaphoresis and chills with fever 6.  History of rheumatoid arthritis and on Humira  Treatment plan: 1.  The findings and treatment plan were discussed in detail with the patient.  Patient was in agreement. 2.  Given the constellation of symptoms for almost a week and a half now as well as the fact that he is on  Humira I am going to go ahead and treat him fairly aggressively. 3.  I prescribed a oral anti-COVID medication that he will take as prescribed.  I also gave him albuterol as well as cough medicine and Flonase. 4.  Continue to treat the fever aggressively with over-the-counter Tylenol or ibuprofen or Motrin. 5.  Educational handouts provided. 6.  Also asked to pick up some Mucinex without the DM component to help thin secretion. 7.  If symptoms persist have asked him to contact his primary care physician's office. 8.  I want him to reach out to his rheumatologist and let them know that he does have COVID and that he is having a hard time getting over the symptoms. 9.  If his symptoms worsen in any way or he becomes acutely short of breath he needs to go to the ER or call 911.  He voiced verbal understanding. 10.  He was discharged in stable condition and he will follow-up here with [place] as needed.    Final Clinical Impressions(s) / UC Diagnoses   Final diagnoses:  COVID-19  Febrile illness  Cough  SOB (shortness of breath)  Diaphoresis     Discharge Instructions      As we discussed, given your persistent symptoms with COVID I am going to treat you fairly aggressively. I am giving you a oral COVID medication that you will take as prescribed.  Please call your rheumatologist and let them know that you are having persistent symptoms.  It may be related to the fact that you are on Humira and your immune system is diminished a little bit. Given your cough I gave you a cough medicine. Given your nasal congestion I gave you a nasal steroid.  Also with your cough I have prescribed an albuterol inhaler to help open up your lower bronchioles and help you cough some of that stuff up. You can also pick up Mucinex over-the-counter 1200 mg twice a day without the DM component. Use Tylenol, Motrin, ibuprofen for any fever shakes chills. Please see educational handouts. If your symptoms persist please  contact Dr. Barnett Applebaum office. Certainly if your symptoms were to worsen in any way then please go to the emergency room for a higher level of care.     ED Prescriptions     Medication Sig Dispense Auth. Provider   albuterol (VENTOLIN HFA) 108 (90 Base) MCG/ACT inhaler Inhale 1-2 puffs into the lungs every 6 (six) hours as needed for wheezing or shortness of breath. 1 each Delton See, MD   molnupiravir EUA 200 mg CAPS Take 4 capsules (800 mg total) by mouth 2 (two) times daily for 5 days. 40 capsule Delton See, MD   promethazine-dextromethorphan (PROMETHAZINE-DM) 6.25-15 MG/5ML syrup Take 5 mLs by mouth 4 (four) times daily as needed for cough. 180 mL Delton See, MD   fluticasone [facility]) 50 MCG/ACT nasal spray Place 2 sprays into both nostrils daily. 15.8 mL Delton See, MD      PDMP not reviewed this encounter.   Delton See, MD 01/24/21 2141

## 2021-04-01 ENCOUNTER — Ambulatory Visit: Payer: BC Managed Care – PPO | Admitting: Family Medicine

## 2021-04-01 ENCOUNTER — Encounter: Payer: Self-pay | Admitting: Family Medicine

## 2021-04-01 ENCOUNTER — Other Ambulatory Visit: Payer: Self-pay

## 2021-04-01 VITALS — BP 120/70 | HR 80 | Ht 68.0 in | Wt 224.0 lb

## 2021-04-01 DIAGNOSIS — R7401 Elevation of levels of liver transaminase levels: Secondary | ICD-10-CM | POA: Diagnosis not present

## 2021-04-01 DIAGNOSIS — I10 Essential (primary) hypertension: Secondary | ICD-10-CM | POA: Diagnosis not present

## 2021-04-01 DIAGNOSIS — E782 Mixed hyperlipidemia: Secondary | ICD-10-CM

## 2021-04-01 DIAGNOSIS — F329 Major depressive disorder, single episode, unspecified: Secondary | ICD-10-CM | POA: Diagnosis not present

## 2021-04-01 DIAGNOSIS — R061 Stridor: Secondary | ICD-10-CM

## 2021-04-01 MED ORDER — LISINOPRIL-HYDROCHLOROTHIAZIDE 10-12.5 MG PO TABS: ORAL_TABLET | ORAL | 1 refills | Status: DC

## 2021-04-01 MED ORDER — ATORVASTATIN CALCIUM 10 MG PO TABS: 10.0000 mg | ORAL_TABLET | Freq: Every day | ORAL | 1 refills | Status: DC

## 2021-04-01 NOTE — Progress Notes (Signed)
Date:  04/01/2021   Name:  Brendan Ray   DOB:  [birth year]   MRN:  [id number]   Chief Complaint: Hyperlipidemia and Hypertension  Hyperlipidemia This is a chronic problem. The current episode started more than 1 year ago. The problem is controlled. He has no history of chronic renal disease. Pertinent negatives include no chest pain, focal weakness, leg pain, myalgias or shortness of breath. Current antihyperlipidemic treatment includes statins. The current treatment provides significant improvement of lipids. There are no compliance problems.  Risk factors for coronary artery disease include dyslipidemia and hypertension.  Hypertension This is a chronic problem. The current episode started more than 1 year ago. The problem has been gradually improving since onset. The problem is controlled. Pertinent negatives include no anxiety, blurred vision, chest pain, headaches, malaise/fatigue, neck pain, palpitations, peripheral edema, shortness of breath or sweats. Past treatments include ACE inhibitors and diuretics. The current treatment provides significant improvement. There are no compliance problems.  There is no history of angina, kidney disease, CAD/MI, CVA, heart failure, left ventricular hypertrophy, PVD or retinopathy. There is no history of chronic renal disease, a hypertension causing med or renovascular disease.   Lab Results  Component Value Date   CREATININE 1.04 10/15/2020   BUN 9 10/15/2020   NA 139 10/15/2020   K 3.9 10/15/2020   CL 103 10/15/2020   CO2 21 10/15/2020   Lab Results  Component Value Date   CHOL 184 10/15/2020   HDL 42 10/15/2020   LDLCALC 116 (H) 10/15/2020   TRIG 148 10/15/2020   CHOLHDL 4.6 01/29/2017   No results found for: TSH No results found for: HGBA1C No results found for: WBC, HGB, HCT, MCV, PLT Lab Results  Component Value Date   ALT 77 (H) 10/15/2020   AST 53 (H) 10/15/2020   ALKPHOS 82 10/15/2020   BILITOT 0.5 10/15/2020     Review  of Systems  Constitutional:  Negative for chills, fever and malaise/fatigue.  HENT:  Negative for drooling, ear discharge, ear pain and sore throat.   Eyes:  Negative for blurred vision.  Respiratory:  Positive for stridor. Negative for cough, shortness of breath and wheezing.   Cardiovascular:  Negative for chest pain, palpitations and leg swelling.  Gastrointestinal:  Negative for abdominal pain, blood in stool, constipation, diarrhea and nausea.  Endocrine: Negative for polydipsia.  Genitourinary:  Negative for dysuria, frequency, hematuria and urgency.  Musculoskeletal:  Negative for back pain, myalgias and neck pain.  Skin:  Negative for rash.  Allergic/Immunologic: Negative for environmental allergies.  Neurological:  Negative for dizziness, focal weakness and headaches.  Hematological:  Does not bruise/bleed easily.  Psychiatric/Behavioral:  Negative for suicidal ideas. The patient is not nervous/anxious.    There are no problems to display for this patient.   No Known Allergies  Past Surgical History:  Procedure Laterality Date   NASAL SINUS SURGERY      Social History   Tobacco Use   Smoking status: Never   Smokeless tobacco: Never  Vaping Use   Vaping Use: Never used  Substance Use Topics   Alcohol use: No    Alcohol/week: 0.0 standard drinks   Drug use: No     Medication list has been reviewed and updated.  Current Meds  Medication Sig   Adalimumab (HUMIRA) 20 MG/0.2ML PSKT Inject into the skin. DOSE UNKNOWN   albuterol (VENTOLIN HFA) 108 (90 Base) MCG/ACT inhaler Inhale 1-2 puffs into the lungs every 6 (six) hours as  needed for wheezing or shortness of breath.   fluticasone (FLONASE) 50 MCG/ACT nasal spray Place 2 sprays into both nostrils daily.   folic acid (FOLVITE) 1 MG tablet Take 1 mg by mouth daily. Rheumatology   [DISCONTINUED] atorvastatin (LIPITOR) 10 MG tablet Take 1 tablet (10 mg total) by mouth daily.   [DISCONTINUED] DULoxetine (CYMBALTA) 30  MG capsule Take 1 capsule (30 mg total) by mouth daily.   [DISCONTINUED] lisinopril-hydrochlorothiazide (ZESTORETIC) 10-12.5 MG tablet TAKE 1 TABLET BY MOUTH EVERY DAY   [DISCONTINUED] methotrexate (RHEUMATREX) 2.5 MG tablet Take by mouth. Patel    [facility] 2/9 Scores 04/01/2021 10/15/2020 01/02/2020 07/06/2018  PHQ - 2 Score 0 1 0 0  PHQ- 9 Score 0 4 0 0    GAD 7 : Generalized Anxiety Score 04/01/2021 10/15/2020 01/02/2020  Nervous, Anxious, on Edge 0 1 0  Control/stop worrying 0 0 0  Worry too much - different things 0 0 0  Trouble relaxing 0 0 0  Restless 0 0 0  Easily annoyed or irritable 0 1 0  Afraid - awful might happen 0 0 0  Total GAD 7 Score 0 2 0  Anxiety Difficulty - Not difficult at all -    BP Readings from Last 3 Encounters:  04/01/21 120/70  01/24/21 (!) 155/80  10/15/20 132/82    Physical Exam Vitals and nursing note reviewed.  HENT:     Head: Normocephalic.     Right Ear: Tympanic membrane, ear canal and external ear normal.     Left Ear: Tympanic membrane, ear canal and external ear normal.     Nose: Nose normal. No congestion or rhinorrhea.  Eyes:     General: No scleral icterus.       Right eye: No discharge.        Left eye: No discharge.     Conjunctiva/sclera: Conjunctivae normal.     Pupils: Pupils are equal, round, and reactive to light.  Neck:     Thyroid: No thyromegaly.     Vascular: No JVD.     Trachea: No tracheal deviation.  Cardiovascular:     Rate and Rhythm: Normal rate and regular rhythm.     Heart sounds: Normal heart sounds. No murmur heard.   No friction rub. No gallop.  Pulmonary:     Effort: No respiratory distress.     Breath sounds: Normal breath sounds. No wheezing, rhonchi or rales.  Abdominal:     General: Bowel sounds are normal.     Palpations: Abdomen is soft. There is no mass.     Tenderness: There is no abdominal tenderness. There is no guarding or rebound.  Musculoskeletal:        General: No tenderness. Normal range of  motion.     Cervical back: Normal range of motion and neck supple.  Lymphadenopathy:     Cervical: No cervical adenopathy.  Skin:    General: Skin is warm.     Findings: No rash.  Neurological:     Mental Status: He is alert and oriented to person, place, and time.     Cranial Nerves: No cranial nerve deficit.     Deep Tendon Reflexes: Reflexes are normal and symmetric.    Wt Readings from Last 3 Encounters:  04/01/21 224 lb (101.6 kg)  10/15/20 230 lb (104.3 kg)  04/19/20 229 lb (103.9 kg)    BP 120/70   Pulse 80   Ht 5\' 8"  (1.727 m)   Wt 224 lb (101.6 kg)  BMI 34.06 kg/m   Assessment and Plan: 1. Essential hypertension Chronic.  Controlled.  Stable.  Blood pressure 120/70.  Continue lisinopril hydrochlorothiazide 10-12.5 mg once a day and will recheck renal panel in 6 months - lisinopril-hydrochlorothiazide (ZESTORETIC) 10-12.5 MG tablet; TAKE 1 TABLET BY MOUTH EVERY DAY  Dispense: 90 tablet; Refill: 1  2. Hyperlipidemia, mixed Chronic.  Controlled.  Stable.  Continue atorvastatin 10 mg once a day.  Will check lipid panel in 6 months. - atorvastatin (LIPITOR) 10 MG tablet; Take 1 tablet (10 mg total) by mouth daily.  Dispense: 90 tablet; Refill: 1  3. Reactive depression New onset.  Resolved.  Stable.  PHQ is 0 Gad score is 0 patient attributes this to the methotrexate and patient is doing well since this is been switched to Humira.  4. Elevated liver transaminase level As noted above patient is now on Humira.  Previously it was noted that patient's transaminases were elevated and we will check recheck hepatic function panel as well is a hep C antibody. - Hepatic function panel - Hepatitis C antibody  5. Stridor Since COVID patient's had some stridor 3.  Breast-this is most likely positional and I am more concerned about the necessity for evaluating for sleep apnea.  Patient initially refused pneumococcal as well as other vaccines but I have discussed with him being  on an immunosuppressant agent and to discuss this with his rheumatologist and that he may come back for pneumococcal.

## 2021-04-02 LAB — HEPATIC FUNCTION PANEL
ALT: 56 IU/L — ABNORMAL HIGH (ref 0–44)
AST: 32 IU/L (ref 0–40)
Albumin: 3.9 g/dL (ref 3.8–4.9)
Alkaline Phosphatase: 89 IU/L (ref 44–121)
Bilirubin Total: 0.5 mg/dL (ref 0.0–1.2)
Bilirubin, Direct: 0.15 mg/dL (ref 0.00–0.40)
Total Protein: 7.3 g/dL (ref 6.0–8.5)

## 2021-04-02 LAB — HEPATITIS C ANTIBODY: Hep C Virus Ab: <0.1 s/co ratio (ref 0.0–0.9)

## 2021-04-05 ENCOUNTER — Other Ambulatory Visit: Payer: Self-pay

## 2021-04-05 DIAGNOSIS — Z1211 Encounter for screening for malignant neoplasm of colon: Secondary | ICD-10-CM

## 2021-04-05 NOTE — Progress Notes (Signed)
Ref to GI for colonoscopy

## 2021-04-11 ENCOUNTER — Telehealth: Payer: Self-pay

## 2021-04-11 NOTE — Telephone Encounter (Signed)
Called patient no answer no way to leave message 

## 2021-05-29 DIAGNOSIS — M0609 Rheumatoid arthritis without rheumatoid factor, multiple sites: Secondary | ICD-10-CM | POA: Diagnosis not present

## 2021-05-29 DIAGNOSIS — Z79899 Other long term (current) drug therapy: Secondary | ICD-10-CM | POA: Diagnosis not present

## 2021-06-14 ENCOUNTER — Other Ambulatory Visit: Payer: Self-pay | Admitting: Family Medicine

## 2021-06-14 DIAGNOSIS — I10 Essential (primary) hypertension: Secondary | ICD-10-CM

## 2021-06-15 NOTE — Telephone Encounter (Signed)
Requested medication (s) are due for refill today: no  Requested medication (s) are on the active medication list: yes  Last refill:  04/01/21  Future visit scheduled: yes  Notes to clinic:  overdue lab work   Requested Prescriptions  Pending Prescriptions Disp Refills   lisinopril-hydrochlorothiazide (ZESTORETIC) 10-12.5 MG tablet [Pharmacy Med Name: LISINOPRIL-HCTZ 10-12.5 MG TAB] 90 tablet 1    Sig: TAKE 1 TABLET BY MOUTH EVERY DAY     Cardiovascular:  ACEI + Diuretic Combos Failed - 06/14/2021 12:40 PM      Failed - Na in normal range and within 180 days    Sodium  Date Value Ref Range Status  10/15/2020 139 134 - 144 mmol/L Final          Failed - K in normal range and within 180 days    Potassium  Date Value Ref Range Status  10/15/2020 3.9 3.5 - 5.2 mmol/L Final          Failed - Cr in normal range and within 180 days    Creatinine, Ser  Date Value Ref Range Status  10/15/2020 1.04 0.76 - 1.27 mg/dL Final          Failed - Ca in normal range and within 180 days    Calcium  Date Value Ref Range Status  10/15/2020 9.5 8.7 - 10.2 mg/dL Final          Passed - Patient is not pregnant      Passed - Last BP in normal range    BP Readings from Last 1 Encounters:  04/01/21 120/70          Passed - Valid encounter within last 6 months    Recent Outpatient Visits           2 months ago Essential hypertension   [facility] Duanne Limerick, MD   8 months ago Essential hypertension   [facility] Duanne Limerick, MD   1 year ago Arthralgia, unspecified joint   [facility] Duanne Limerick, MD   1 year ago Arthralgia, unspecified joint   [facility] Duanne Limerick, MD   1 year ago Essential hypertension   [facility] Duanne Limerick, MD       Future Appointments             In 3 months Duanne Limerick, MD [facility], [facility]

## 2021-07-11 DIAGNOSIS — Z0189 Encounter for other specified special examinations: Secondary | ICD-10-CM | POA: Diagnosis not present

## 2021-07-11 DIAGNOSIS — J309 Allergic rhinitis, unspecified: Secondary | ICD-10-CM | POA: Diagnosis not present

## 2021-10-01 ENCOUNTER — Ambulatory Visit: Payer: BC Managed Care – PPO | Admitting: Family Medicine

## 2022-02-03 DIAGNOSIS — J309 Allergic rhinitis, unspecified: Secondary | ICD-10-CM | POA: Diagnosis not present

## 2022-04-03 ENCOUNTER — Ambulatory Visit: Payer: BC Managed Care – PPO | Admitting: Family Medicine

## 2022-04-07 ENCOUNTER — Encounter: Payer: Self-pay | Admitting: Family Medicine

## 2022-07-05 DIAGNOSIS — B342 Coronavirus infection, unspecified: Secondary | ICD-10-CM | POA: Diagnosis not present

## 2022-09-22 DIAGNOSIS — R6889 Other general symptoms and signs: Secondary | ICD-10-CM | POA: Diagnosis not present

## 2022-10-09 DIAGNOSIS — H9313 Tinnitus, bilateral: Secondary | ICD-10-CM | POA: Diagnosis not present

## 2022-10-09 DIAGNOSIS — H903 Sensorineural hearing loss, bilateral: Secondary | ICD-10-CM | POA: Diagnosis not present

## 2022-10-09 DIAGNOSIS — J018 Other acute sinusitis: Secondary | ICD-10-CM | POA: Diagnosis not present

## 2022-10-09 DIAGNOSIS — H6983 Other specified disorders of Eustachian tube, bilateral: Secondary | ICD-10-CM | POA: Diagnosis not present

## 2023-05-13 LAB — CBC AND DIFFERENTIAL
HCT: 50 (ref 41–53)
Hemoglobin: 17.4 (ref 13.5–17.5)
Platelets: 237 10*3/uL (ref 150–400)
WBC: 9.6

## 2023-05-13 LAB — HEMOGLOBIN A1C: Hemoglobin A1C: 5.6

## 2023-05-13 LAB — COMPREHENSIVE METABOLIC PANEL
Albumin: 4.2 (ref 3.5–5.0)
Calcium: 8.8 (ref 8.7–10.7)
eGFR: 86

## 2023-05-13 LAB — HEPATIC FUNCTION PANEL
ALT: 37 U/L (ref 10–40)
AST: 22 (ref 14–40)
Alkaline Phosphatase: 108 (ref 25–125)
Bilirubin, Total: 0.6

## 2023-05-13 LAB — BASIC METABOLIC PANEL
BUN: 9 (ref 4–21)
Chloride: 104 (ref 99–108)
Creatinine: 1 (ref 0.6–1.3)
Glucose: 117
Potassium: 4.2 meq/L (ref 3.5–5.1)
Sodium: 139 (ref 137–147)

## 2023-05-13 LAB — VITAMIN D 25 HYDROXY (VIT D DEFICIENCY, FRACTURES): Vit D, 25-Hydroxy: 30.6

## 2023-05-13 LAB — LIPID PANEL
Cholesterol: 192 (ref 0–200)
HDL: 46 (ref 35–70)
LDL Cholesterol: 131
Triglycerides: 83 (ref 40–160)

## 2023-05-13 LAB — PSA: PSA: 0.5

## 2023-05-13 LAB — CBC: RBC: 5.46 — AB (ref 3.87–5.11)

## 2023-05-13 LAB — TSH: TSH: 1.48 (ref 0.41–5.90)

## 2023-05-21 ENCOUNTER — Ambulatory Visit (INDEPENDENT_AMBULATORY_CARE_PROVIDER_SITE_OTHER): Payer: BC Managed Care – PPO | Admitting: Family Medicine

## 2023-05-21 ENCOUNTER — Ambulatory Visit
Admission: EM | Admit: 2023-05-21 | Discharge: 2023-05-21 | Disposition: A | Discharge status: Home / Self Care | Payer: BC Managed Care – PPO | Attending: Emergency Medicine | Admitting: Emergency Medicine

## 2023-05-21 ENCOUNTER — Encounter: Payer: Self-pay | Admitting: Family Medicine

## 2023-05-21 ENCOUNTER — Other Ambulatory Visit: Payer: Self-pay

## 2023-05-21 VITALS — BP 132/78 | HR 97 | Ht 68.0 in | Wt 228.0 lb

## 2023-05-21 DIAGNOSIS — R079 Chest pain, unspecified: Secondary | ICD-10-CM | POA: Diagnosis not present

## 2023-05-21 DIAGNOSIS — R000 Tachycardia, unspecified: Secondary | ICD-10-CM | POA: Diagnosis not present

## 2023-05-21 DIAGNOSIS — I10 Essential (primary) hypertension: Secondary | ICD-10-CM

## 2023-05-21 DIAGNOSIS — R002 Palpitations: Secondary | ICD-10-CM

## 2023-05-21 DIAGNOSIS — E782 Mixed hyperlipidemia: Secondary | ICD-10-CM

## 2023-05-21 DIAGNOSIS — Z760 Encounter for issue of repeat prescription: Secondary | ICD-10-CM

## 2023-05-21 HISTORY — DX: Rheumatoid arthritis, unspecified: M06.9

## 2023-05-21 MED ORDER — LISINOPRIL-HYDROCHLOROTHIAZIDE 10-12.5 MG PO TABS: ORAL_TABLET | ORAL | 0 refills | Status: DC

## 2023-05-21 MED ORDER — LISINOPRIL-HYDROCHLOROTHIAZIDE 10-12.5 MG PO TABS: ORAL_TABLET | ORAL | 1 refills | Status: DC

## 2023-05-21 NOTE — Discharge Instructions (Addendum)
-  EKG normal today. - Blood pressure is little elevated.  Continue the blood pressure medicine.  I gave you several refills but I would like you to follow-up with a primary care provider.  If you do not go back to med medical follow-up elsewhere but try to do so in the next couple months. - If you have another bout of elevated heart rate restarted to feel worse you need to go to the emergency department.  Just because your EKG was normal today does not mean that it would have been normal when your heart rate was 150 beats a minute. -Please follow up with rheumatology to discuss your options.

## 2023-05-21 NOTE — Progress Notes (Signed)
Date:  05/21/2023   Name:  Brendan Ray   DOB:  06-08-1966   MRN:  [id number]   Chief Complaint: Hypertension  Patient made appointment this morning to refill his blood pressure medicine.  Patient was last seen in September 2022 at which time he was prescribed lisinopril hydrochlorothiazide 10-12.5 mg a day for hypertension and atorvastatin 10 mg once a day.  The following conversation as I recall was 1) we would not be able to do medication refill visit today and that we can give him 30 days of medicine and that we would need to concentrate on his chest pain and palpitations today.. 2) as far as the palpitations and chest pain I asked patient what he thought was going on/patient related that he thought it was related to his rheumatoid disease/I did cut the patient short and told him that I did not think that this would be the issue meaning causing his tach arrhythmia and chest pain.  Patient became angry about not wanting to be seen which I reassured him that we needed to concentrate on his chest pain and palpitation and that I thought it was important that he stay and be seen.3) my question was to ask him "tell me what happened" patient then became angry again and jumped up and said that he would not be staying for evaluation.  Hypertension This is a chronic problem. The current episode started more than 1 year ago. Progression since onset: uncertain /not seen since 2022. Condition status: current in normal range. Associated symptoms include chest pain and palpitations. Pertinent negatives include no shortness of breath. Past treatments include ACE inhibitors and diuretics. There are no compliance problems.   Hyperlipidemia This is a chronic problem. Condition status: Uncertain. Associated symptoms include chest pain. Pertinent negatives include no shortness of breath. Current antihyperlipidemic treatment includes statins (Atorvastatin 10 mg).  Chest Pain  This is a new problem. The current  episode started yesterday. Progression since onset: Currently no chest pain. Pain location: Across the chest. Radiates to: Patient did not mention any radiation. Associated symptoms include palpitations. Pertinent negatives include no shortness of breath.  His past medical history is significant for hyperlipidemia and hypertension.  Palpitations  This is a new problem. The current episode started yesterday (Patient states yesterday he had the onset of rapid heartbeat described as "heart beating out of my chest" this was associated heart rate of 155 for 3 hours.  t). Associated symptoms include chest pain. Pertinent negatives include no shortness of breath. He has tried nothing for the symptoms.    Lab Results  Component Value Date   NA 139 10/15/2020   K 3.9 10/15/2020   CO2 21 10/15/2020   GLUCOSE 102 (H) 10/15/2020   BUN 9 10/15/2020   CREATININE 1.04 10/15/2020   CALCIUM 9.5 10/15/2020   EGFR 85 10/15/2020   GFRNONAA 70 01/02/2020   Lab Results  Component Value Date   CHOL 184 10/15/2020   HDL 42 10/15/2020   LDLCALC 116 (H) 10/15/2020   TRIG 148 10/15/2020   CHOLHDL 4.6 01/29/2017   No results found for: "TSH" No results found for: "HGBA1C" No results found for: "WBC", "HGB", "HCT", "MCV", "PLT" Lab Results  Component Value Date   ALT 56 (H) 04/01/2021   AST 32 04/01/2021   ALKPHOS 89 04/01/2021   BILITOT 0.5 04/01/2021   No results found for: "25OHVITD2", "25OHVITD3", "VD25OH"   Review of Systems  Respiratory:  Negative for shortness of breath.  Cardiovascular:  Positive for chest pain and palpitations.    There are no problems to display for this patient.   No Known Allergies  Past Surgical History:  Procedure Laterality Date   NASAL SINUS SURGERY      Social History   Tobacco Use   Smoking status: Never   Smokeless tobacco: Never  Vaping Use   Vaping status: Never Used  Substance Use Topics   Alcohol use: No    Alcohol/week: 0.0 standard drinks of  alcohol   Drug use: No     Medication list has been reviewed and updated.  Current Meds  Medication Sig   fluticasone (FLONASE) 50 MCG/ACT nasal spray Place 2 sprays into both nostrils daily.   lisinopril-hydrochlorothiazide (ZESTORETIC) 10-12.5 MG tablet TAKE 1 TABLET BY MOUTH EVERY DAY   [DISCONTINUED] Adalimumab (HUMIRA) 20 MG/0.2ML PSKT Inject into the skin. DOSE UNKNOWN   [DISCONTINUED] folic acid (FOLVITE) 1 MG tablet Take 1 mg by mouth daily. Rheumatology       04/01/2021    8:29 AM 10/15/2020    8:28 AM 01/02/2020    8:06 AM  GAD 7 : Generalized Anxiety Score  Nervous, Anxious, on Edge 0 1 0  Control/stop worrying 0 0 0  Worry too much - different things 0 0 0  Trouble relaxing 0 0 0  Restless 0 0 0  Easily annoyed or irritable 0 1 0  Afraid - awful might happen 0 0 0  Total GAD 7 Score 0 2 0  Anxiety Difficulty  Not difficult at all        04/01/2021    8:29 AM 10/15/2020    8:27 AM 01/02/2020    8:06 AM  Depression screen PHQ 2/9  Decreased Interest 0 0 0  Down, Depressed, Hopeless 0 1 0  PHQ - 2 Score 0 1 0  Altered sleeping 0 1 0  Tired, decreased energy 0 2 0  Change in appetite 0 0 0  Feeling bad or failure about yourself  0 0 0  Trouble concentrating 0 0 0  Moving slowly or fidgety/restless 0 0 0  Suicidal thoughts 0 0 0  PHQ-9 Score 0 4 0  Difficult doing work/chores  Not difficult at all     BP Readings from Last 3 Encounters:  05/21/23 132/78  04/01/21 120/70  01/24/21 (!) 155/80    Physical Exam Physical exam was not done due to patient leaving without being seen. Wt Readings from Last 3 Encounters:  05/21/23 228 lb (103.4 kg)  04/01/21 224 lb (101.6 kg)  10/15/20 230 lb (104.3 kg)    BP 132/78   Pulse 97   Ht 5\' 8"  (1.727 m)   Wt 228 lb (103.4 kg)   SpO2 98%   BMI 34.67 kg/m   Assessment and Plan:  1. Hyperlipidemia, mixed Previous history of elevated cholesterol last seen in September 2022 and prescribed atorvastatin 10 mg  once a day.Marland Kitchen  Apparently patient had lipid panel done on 05/13/2023 which was unremarkable.  2. Essential hypertension Previous history of elevated blood pressure was seen in September 2022 and I prescribed lisinopril hydrochlorothiazide.  I did refill this medication for 90 days and that the patient left without being evaluated to make sure that he had medication during the time prior that he is able to get reestablished. - lisinopril-hydrochlorothiazide (ZESTORETIC) 10-12.5 MG tablet; TAKE 1 TABLET BY MOUTH EVERY DAY  Dispense: 90 tablet; Refill: 0  3. Chest pain, unspecified type New onset.  Patient describes chest pain for 3 hours associated with tach arrhythmia.  This resolved spontaneously and EKG was done.  Sinus rhythm rate 89 intervals normal no ST-T wave changes indicative of ischemic disease - EKG 12-Lead  4.  Palpitations.  Patient had episodes that his "heart was beating out of his chest "by his estimate lasted 3 hours.  Other than the EKG we were unable to do an exam.  Pulse per history of CNA was noted to be normal rhythm which would unlikely be of an atrial fibs nature.  Elizabeth Sauer, MD

## 2023-05-21 NOTE — ED Triage Notes (Signed)
Pt here for elevated BP and elevated HR of 155 x 3 hours 2 days ago with chest tightness. Was seen by PMD today, Dr. Yetta Barre EKG was done pt not sure of results.

## 2023-05-21 NOTE — ED Provider Notes (Signed)
[facility]    CSN: [id number] Arrival date & time: 05/21/23  1547      History   Chief Complaint Chief Complaint  Patient presents with   Hypertension    HPI Brendan Ray is a 57 y.o. male presenting for hypertension and tachycardia. Also requesting a refill of his BP medication. Patient presents to urgent care after having an encounter with PCP which left him feeling unheard. He says he was spoken to like he was a child so he left.   Patient states his heart rate was about 150 bpm for a couple of hours about 2-3 days ago. Reports feeling "terrible today." Has been out of his Lisinopril for a few days and BP has been elevated. He says he was able to come of the medicine last year since he lost weight but put himself back on his medicine a few months ago. Reports BP is "perfect" when he is taking the medicine.   He denies chest pain, sweats, dizziness, weakness. Reports fatigue and headaches, but that is not abnormal. States he will take Advil and feel better.   He has a history of RA and says it has been flared up off and on for the past 6 months. He is not on any medication for this and manages his pain with NSAIDs as needed. He did initially see a rheumatologist and was on methotrexate but was getting too many infections.   Family history of arrhythmias and that is something he is concerned about. Has not seen a cardiologist. No previous history of cardiovascular issues. Does not smoke, use drugs or alcohol.  HPI  Past Medical History:  Diagnosis Date   Hypertension    Rheumatoid arthritis (HCC)     There are no problems to display for this patient.   Past Surgical History:  Procedure Laterality Date   NASAL SINUS SURGERY         Home Medications    Prior to Admission medications   Medication Sig Start Date End Date Taking? Authorizing Provider  fluticasone (FLONASE) 50 MCG/ACT nasal spray Place 2 sprays into both nostrils daily. 01/24/21  Yes Delton See, MD  lisinopril-hydrochlorothiazide (ZESTORETIC) 10-12.5 MG tablet TAKE 1 TABLET BY MOUTH EVERY DAY 05/21/23   Shirlee Latch, PA-C    Family History Family History  Problem Relation Age of Onset   Healthy Mother    Healthy Father     Social History Social History   Tobacco Use   Smoking status: Never   Smokeless tobacco: Never  Vaping Use   Vaping status: Never Used  Substance Use Topics   Alcohol use: No    Alcohol/week: 0.0 standard drinks of alcohol   Drug use: No     Allergies   Patient has no known allergies.   Review of Systems Review of Systems  Constitutional:  Positive for fatigue. Negative for diaphoresis.  Eyes:  Negative for visual disturbance.  Respiratory:  Negative for chest tightness and shortness of breath.   Cardiovascular:  Positive for palpitations. Negative for chest pain.  Gastrointestinal:  Negative for abdominal pain, nausea and vomiting.  Musculoskeletal:  Positive for arthralgias.  Neurological:  Positive for headaches. Negative for dizziness, syncope and weakness.     Physical Exam Triage Vital Signs ED Triage Vitals  Encounter Vitals Group     BP 05/21/23 1633 (!) 190/118     Systolic BP Percentile --      Diastolic BP Percentile --  Pulse Rate 05/21/23 1633 92     Resp 05/21/23 1633 18     Temp 05/21/23 1636 98.5 F (36.9 C)     Temp src --      SpO2 05/21/23 1633 100 %     Weight --      Height --      Head Circumference --      Peak Flow --      Pain Score 05/21/23 1633 0     Pain Loc --      Pain Education --      Exclude from Growth Chart --    No data found.  Updated Vital Signs BP (!) 160/100 (BP Location: Left Arm)   Pulse 95   Temp 98.5 F (36.9 C)   Resp 20   SpO2 100%    Physical Exam Vitals and nursing note reviewed.  Constitutional:      General: He is not in acute distress.    Appearance: Normal appearance. He is well-developed. He is not ill-appearing.  HENT:     Head:  Normocephalic and atraumatic.     Nose: Nose normal.     Mouth/Throat:     Mouth: Mucous membranes are moist.     Pharynx: Oropharynx is clear.  Eyes:     General: No scleral icterus.    Conjunctiva/sclera: Conjunctivae normal.  Cardiovascular:     Rate and Rhythm: Normal rate and regular rhythm.     Heart sounds: Normal heart sounds.  Pulmonary:     Effort: Pulmonary effort is normal. No respiratory distress.     Breath sounds: Normal breath sounds.  Abdominal:     Palpations: Abdomen is soft.  Musculoskeletal:     Cervical back: Neck supple.  Skin:    General: Skin is warm and dry.     Capillary Refill: Capillary refill takes less than 2 seconds.  Neurological:     General: No focal deficit present.     Mental Status: He is alert. Mental status is at baseline.     Motor: No weakness.     Gait: Gait normal.  Psychiatric:        Mood and Affect: Mood normal.        Behavior: Behavior normal.      UC Treatments / Results  Labs (all labs ordered are listed, but only abnormal results are displayed) Labs Reviewed - No data to display  EKG   Radiology No results found.  Procedures ED EKG  Date/Time: 05/21/2023 5:47 PM  Performed by: Shirlee Latch, PA-C Authorized by: Domenick Gong, MD   Interpretation:    Interpretation: normal   Rate:    ECG rate:  84 Rhythm:    Rhythm: sinus rhythm   Ectopy:    Ectopy: none   QRS:    QRS axis:  Left   QRS intervals:  Normal   QRS conduction: normal   ST segments:    ST segments:  Normal T waves:    T waves: normal   Comments:     Normal sinus rhythm and regular rate of 84 bpm  (including critical care time)  Medications Ordered in UC Medications - No data to display  Initial Impression / Assessment and Plan / UC Course  I have reviewed the triage vital signs and the nursing notes.  Pertinent labs & imaging results that were available during my care of the patient were reviewed by me and considered in my  medical decision making (see  chart for details).   57 year old male with history of hypertension and rheumatoid arthritis presents for evaluation of elevated blood pressure and tachycardia of 152 to 3 days ago which lasted about 3 hours.  He has not had any further episodes of tachycardia and denies palpitations, chest pain, dizziness, weakness, breathing difficulty.  Reports he was previously on lisinopril HCTZ but stopped taking it since he had lost weight.  He has placed himself back on medication over the past 6 months and that has brought his blood pressure to a normal level.  Reports he went to see his PCP today to get a refill of his blood pressure medication and discuss his concerns about the elevated heart rate.  He has no family history of arrhythmias.  He says that he was not treated well at the primary care office.  Reports he had an EKG but does not know the results.  States that he left and thought he should be seen somewhere else because he was not being heard and felt like he was being berated.  Initial BP was 190/118 in triage patient has a physically anxious, frustrated and upset.  Recheck blood pressure is 184, weight.  He continues to gradually cut down.  Third check is 166/99 and final manual check is 160/100.  Patient does report still being worked up.  Blood pressure in the PCP office was 150s systolic.  He says when he is on medication his blood pressure will be normal.  He has been off the medicine for about 3 to 4 days.  EKG performed today shows normal sinus rhythm and regular rate.  EKG performed given symptoms of tachycardia a few days ago.  Essentially benign.  Chest clear to auscultation and heart regular rate and rhythm.  Advised patient that I would refill his blood pressure medication for the next few months until he can get set up with a new PCP.  He does not plan to go back to see his current PCP.  We discussed referral to cardiology but he has not had to hold off at  this time.  He has been advised to call 911 or have someone take him to the ER if he has another bout of elevated heart rate or ever has chest pain, dizziness, weakness, syncope, etc.  In the meantime advised him to keep a log of his blood pressure.  Encouraged him to follow-up with his rheumatologist regarding his rheumatoid arthritis.  Advised him to discuss potential treatment options as he is not currently on any medication for this.   Final Clinical Impressions(s) / UC Diagnoses   Final diagnoses:  Essential hypertension  Tachycardia  Medication refill     Discharge Instructions      -EKG normal today. - Blood pressure is little elevated.  Continue the blood pressure medicine.  I gave you several refills but I would like you to follow-up with a primary care provider.  If you do not go back to med medical follow-up elsewhere but try to do so in the next couple months. - If you have another bout of elevated heart rate restarted to feel worse you need to go to the emergency department.  Just because your EKG was normal today does not mean that it would have been normal when your heart rate was 150 beats a minute. -Please follow up with rheumatology to discuss your options.     ED Prescriptions     Medication Sig Dispense Auth. Provider   lisinopril-hydrochlorothiazide (ZESTORETIC) 10-12.5  MG tablet TAKE 1 TABLET BY MOUTH EVERY DAY 90 tablet Shirlee Latch, [place]      PDMP not reviewed this encounter.   Shirlee Latch, PA-C 05/21/23 1753

## 2023-06-12 NOTE — Addendum Note (Signed)
Encounter addended by: Deatra Canter, RN on: 06/12/2023 9:22 AM  Actions taken: Letter saved

## 2023-06-30 DIAGNOSIS — Z79899 Other long term (current) drug therapy: Secondary | ICD-10-CM | POA: Diagnosis not present

## 2023-06-30 DIAGNOSIS — M0609 Rheumatoid arthritis without rheumatoid factor, multiple sites: Secondary | ICD-10-CM | POA: Diagnosis not present

## 2023-07-02 DIAGNOSIS — H524 Presbyopia: Secondary | ICD-10-CM | POA: Diagnosis not present

## 2023-10-06 DIAGNOSIS — Z79899 Other long term (current) drug therapy: Secondary | ICD-10-CM | POA: Diagnosis not present

## 2023-10-06 DIAGNOSIS — M0609 Rheumatoid arthritis without rheumatoid factor, multiple sites: Secondary | ICD-10-CM | POA: Diagnosis not present

## 2023-10-27 DIAGNOSIS — H2513 Age-related nuclear cataract, bilateral: Secondary | ICD-10-CM | POA: Diagnosis not present

## 2023-10-27 DIAGNOSIS — Z79899 Other long term (current) drug therapy: Secondary | ICD-10-CM | POA: Diagnosis not present

## 2023-12-24 ENCOUNTER — Ambulatory Visit: Admitting: Family Medicine

## 2023-12-24 ENCOUNTER — Encounter: Payer: Self-pay | Admitting: Family Medicine

## 2023-12-24 VITALS — BP 124/78 | HR 76 | Ht 68.0 in | Wt 224.5 lb

## 2023-12-24 DIAGNOSIS — R002 Palpitations: Secondary | ICD-10-CM

## 2023-12-24 NOTE — Progress Notes (Signed)
   Established Patient Office Visit  Subjective   Patient ID: Brendan Ray, male    DOB: Jul 06, 1966  Age: 58 y.o. MRN: [id number]  Chief Complaint  Patient presents with   Establish Care    Formal Dr. Rochelle Chu patient      Assessment & Plan:   Problem List Items Addressed This Visit   None Visit Diagnoses       Palpitations    -  Primary   Relevant Orders   TSH   EKG 12-Lead (Completed)   Comprehensive metabolic panel with GFR   Magnesium   CBC with Differential   Ambulatory referral to Cardiology     In office EKG : NSR, pertinent labs ordered and Cards referral placed. Informed pt to check with Rheumatology about possible side effects related to plaquenil. Pt verbalized understanding.   Return in about 6 months (around 06/24/2024) for HTN with PCP.   Here for acute visit.   Reports he saw Dr.Jones 6 months ago and mentioned about palpitations but nothing was done. States he was angry and did not see a Doctor up until now.   Reports he had Increased HR 6 months ago, 150 beats/min, states it occurred again last week, HR 132/min. Some SOB, mid sternal pressure.  Intermittent in nature, Lasts few hours, resolves by itself. Denies fever, any recent med change or anxiety or new stressors.    RA  Diagnosed 3 years ago.  On plaquenil  Aleve prn. Follows Rhuem Dr.Patel, last visit 3 months,  every 6 months ago.         Review of Systems  All other systems reviewed and are negative.     Objective:     BP 124/78   Pulse 76   Ht 5\' 8"  (1.727 m)   Wt 224 lb 8 oz (101.8 kg)   SpO2 96%   BMI 34.14 kg/m    Physical Exam Vitals and nursing note reviewed.  Constitutional:      Appearance: Normal appearance.  HENT:     Head: Normocephalic.     Right Ear: External ear normal.     Left Ear: External ear normal.  Eyes:     Conjunctiva/sclera: Conjunctivae normal.  Cardiovascular:     Rate and Rhythm: Normal rate and regular rhythm.     Pulses: Normal  pulses.     Heart sounds: Normal heart sounds. No murmur heard. Pulmonary:     Effort: Pulmonary effort is normal. No respiratory distress.  Abdominal:     Palpations: Abdomen is soft.  Musculoskeletal:        General: Normal range of motion.  Skin:    General: Skin is warm.  Neurological:     Mental Status: He is alert and oriented to person, place, and time.  Psychiatric:        Mood and Affect: Mood normal.      No results found for any visits on 12/24/23.      Vinary K Riven Mabile, MD

## 2023-12-28 ENCOUNTER — Encounter: Payer: Self-pay | Admitting: Family Medicine

## 2023-12-29 LAB — CBC WITH DIFFERENTIAL/PLATELET
Basophils Absolute: 0.1 10*3/uL (ref 0.0–0.2)
Basos: 1 %
EOS (ABSOLUTE): 0.1 10*3/uL (ref 0.0–0.4)
Eos: 2 %
Hematocrit: 51.8 % — ABNORMAL HIGH (ref 37.5–51.0)
Hemoglobin: 17.1 g/dL (ref 13.0–17.7)
Immature Grans (Abs): 0 10*3/uL (ref 0.0–0.1)
Immature Granulocytes: 0 %
Lymphocytes Absolute: 2.8 10*3/uL (ref 0.7–3.1)
Lymphs: 40 %
MCH: 30.2 pg (ref 26.6–33.0)
MCHC: 33 g/dL (ref 31.5–35.7)
MCV: 91 fL (ref 79–97)
Monocytes Absolute: 0.6 10*3/uL (ref 0.1–0.9)
Monocytes: 9 %
Neutrophils Absolute: 3.4 10*3/uL (ref 1.4–7.0)
Neutrophils: 48 %
Platelets: 213 10*3/uL (ref 150–450)
RBC: 5.67 x10E6/uL (ref 4.14–5.80)
RDW: 13 % (ref 11.6–15.4)
WBC: 7 10*3/uL (ref 3.4–10.8)

## 2023-12-29 LAB — COMPREHENSIVE METABOLIC PANEL WITH GFR
ALT: 20 IU/L (ref 0–44)
AST: 20 IU/L (ref 0–40)
Albumin: 4.2 g/dL (ref 3.8–4.9)
Alkaline Phosphatase: 88 IU/L (ref 44–121)
BUN/Creatinine Ratio: 15 (ref 9–20)
BUN: 16 mg/dL (ref 6–24)
Bilirubin Total: 0.3 mg/dL (ref 0.0–1.2)
CO2: 23 mmol/L (ref 20–29)
Calcium: 9 mg/dL (ref 8.7–10.2)
Chloride: 101 mmol/L (ref 96–106)
Creatinine, Ser: 1.06 mg/dL (ref 0.76–1.27)
Globulin, Total: 3 g/dL (ref 1.5–4.5)
Glucose: 112 mg/dL — ABNORMAL HIGH (ref 70–99)
Potassium: 4.1 mmol/L (ref 3.5–5.2)
Sodium: 137 mmol/L (ref 134–144)
Total Protein: 7.2 g/dL (ref 6.0–8.5)
eGFR: 81 mL/min/{1.73_m2} (ref >59)

## 2023-12-29 LAB — TSH: TSH: 1.03 u[IU]/mL (ref 0.450–4.500)

## 2023-12-29 LAB — MAGNESIUM: Magnesium: 1.8 mg/dL (ref 1.6–2.3)

## 2023-12-30 DIAGNOSIS — R002 Palpitations: Secondary | ICD-10-CM | POA: Diagnosis not present

## 2023-12-30 DIAGNOSIS — I10 Essential (primary) hypertension: Secondary | ICD-10-CM | POA: Diagnosis not present

## 2023-12-30 DIAGNOSIS — R000 Tachycardia, unspecified: Secondary | ICD-10-CM | POA: Diagnosis not present

## 2024-01-07 DIAGNOSIS — R002 Palpitations: Secondary | ICD-10-CM | POA: Diagnosis not present

## 2024-01-07 DIAGNOSIS — R000 Tachycardia, unspecified: Secondary | ICD-10-CM | POA: Diagnosis not present

## 2024-01-28 DIAGNOSIS — R002 Palpitations: Secondary | ICD-10-CM | POA: Diagnosis not present

## 2024-02-24 DIAGNOSIS — I10 Essential (primary) hypertension: Secondary | ICD-10-CM | POA: Diagnosis not present

## 2024-02-24 DIAGNOSIS — I471 Supraventricular tachycardia, unspecified: Secondary | ICD-10-CM | POA: Diagnosis not present

## 2024-02-24 DIAGNOSIS — R002 Palpitations: Secondary | ICD-10-CM | POA: Diagnosis not present

## 2024-03-04 ENCOUNTER — Encounter: Payer: Self-pay | Admitting: Family Medicine

## 2024-03-04 ENCOUNTER — Ambulatory Visit: Admitting: Family Medicine

## 2024-03-04 VITALS — BP 128/80 | HR 75 | Ht 68.0 in | Wt 232.0 lb

## 2024-03-04 DIAGNOSIS — H9312 Tinnitus, left ear: Secondary | ICD-10-CM

## 2024-03-04 MED ORDER — NEOMYCIN-POLYMYXIN-HC 3.5-10000-1 OT SOLN: 3.0000 [drp] | Freq: Three times a day (TID) | OTIC | 0 refills | Status: AC

## 2024-03-04 NOTE — Progress Notes (Signed)
   Acute Office Visit  Subjective:     Patient ID: KALEM ROCKWELL, male    DOB: 1965-10-29, 58 y.o.   MRN: [id number]  Chief Complaint  Patient presents with   Tinnitus    X more than 6 months, Left ear, getting worse, seen ENT 6 months ago    C/o Tinnitus x 6 months.  Denies ear ache, discharge, dizziness, decreased hearing.  Hx of shooting gun.   He quit taking Plaquneil for 2 weeks to see if it helped, he tried ear drops over the counter, states nothing helped.  History of tinnitus was seen by ENT was told it could be sinus infection.    Review of Systems  All other systems reviewed and are negative.       Objective:    BP 128/80   Pulse 75   Ht 5' 8 (1.727 m)   Wt 232 lb (105.2 kg)   SpO2 97%   BMI 35.28 kg/m     Physical Exam HENT:     Ears:      Comments: Some erythema and retraction noted over left TM. Normal light reflex on right, not so distinct on left.    No results found for any visits on 03/04/24.      Assessment & Plan:   Problem List Items Addressed This Visit   None Visit Diagnoses       Tinnitus of left ear    -  Primary   Relevant Medications   neomycin-polymyxin-hydrocortisone (CORTISPORIN) OTIC solution   Other Relevant Orders   Ambulatory referral to ENT       Meds ordered this encounter  Medications   neomycin-polymyxin-hydrocortisone (CORTISPORIN) OTIC solution    Sig: Place 3 drops into the left ear 3 (three) times daily for 3 days.    Dispense:  10 mL    Refill:  0    No follow-ups on file.  Vinary K Rakan Soffer, MD

## 2024-03-08 DIAGNOSIS — H9312 Tinnitus, left ear: Secondary | ICD-10-CM | POA: Diagnosis not present

## 2024-03-08 DIAGNOSIS — H903 Sensorineural hearing loss, bilateral: Secondary | ICD-10-CM | POA: Diagnosis not present

## 2024-03-09 ENCOUNTER — Other Ambulatory Visit: Payer: Self-pay | Admitting: Otolaryngology

## 2024-03-09 DIAGNOSIS — H9312 Tinnitus, left ear: Secondary | ICD-10-CM

## 2024-03-15 ENCOUNTER — Ambulatory Visit
Admission: RE | Admit: 2024-03-15 | Discharge: 2024-03-15 | Disposition: A | Discharge status: Home / Self Care | Source: Ambulatory Visit | Attending: Otolaryngology | Admitting: Otolaryngology

## 2024-03-15 DIAGNOSIS — H9312 Tinnitus, left ear: Secondary | ICD-10-CM | POA: Diagnosis not present

## 2024-03-15 MED ORDER — GADOPICLENOL 0.5 MMOL/ML IV SOLN
10.0000 mL | Freq: Once | INTRAVENOUS | Status: AC | PRN
  Administered 2024-03-15: 10 mL via INTRAVENOUS

## 2024-04-07 DIAGNOSIS — H9312 Tinnitus, left ear: Secondary | ICD-10-CM | POA: Diagnosis not present

## 2024-04-07 DIAGNOSIS — H903 Sensorineural hearing loss, bilateral: Secondary | ICD-10-CM | POA: Diagnosis not present

## 2024-04-10 DIAGNOSIS — I10 Essential (primary) hypertension: Secondary | ICD-10-CM | POA: Diagnosis not present

## 2024-04-10 DIAGNOSIS — Z79899 Other long term (current) drug therapy: Secondary | ICD-10-CM | POA: Diagnosis not present

## 2024-04-10 DIAGNOSIS — I4891 Unspecified atrial fibrillation: Secondary | ICD-10-CM | POA: Diagnosis not present

## 2024-04-10 DIAGNOSIS — R002 Palpitations: Secondary | ICD-10-CM | POA: Diagnosis not present

## 2024-04-10 DIAGNOSIS — R079 Chest pain, unspecified: Secondary | ICD-10-CM | POA: Diagnosis not present

## 2024-04-10 NOTE — ED Provider Notes (Signed)
 [facility] Emergency Department Progress Note  April 10, 2024 11:17 PM    ED care from [facility], DO at 11:00 PM.  Briefly, Brendan Ray is a 58 y.o. male with a past medical history of SVT, palpitations, hypertension, and RA who presents for heart palpitations starting this afternoon.   On exam, vital signs are remarkable for initial tachycardia to 113 but are otherwise within normal limits. Patient is afebrile.  Labs remarkable for CMP with increased anion gap to 15. Negative initial and 2 hour troponin. Imaging notable for CXR with hazy interstitial prominence possibly due to low lung volumes or mild edema.  Pending 6 hour troponin and reevaluation after fluids. Anticipate discharge.   Medical Decision Making and Progress Notes   Further updates as noted below in ED course: ED Course as of 04/11/24 0357  [facility] Apr 11, 2024  0140 Reevaluated patient.  He states that he is feeling better after IV fluids and metoprolol.  He is willing to be prescribed 25 mg twice daily metoprolol going forward.  He states that he already has a cardiologist, and that he will schedule an appointment for follow-up with them to discuss the metoprolol, his atrial fibrillation, and for further medication management going forward.  Gave strict return precautions      Additional MDM Elements     Discussion with other professionals: None  I have reviewed recent and relavant previous record, including: Outpatient notes - 02/24/24 [facility] note for PMH.  Social Determinants that significantly affected care: None.     I have reviewed the patient's vital signs and the nursing notes. Any pertinent labs & imaging results which were available during my care of the patient were reviewed by me. See chart, prior provider(s) and nursing documentation for additional ED course details.  Portions of this record have been created using Scientist, clinical (histocompatibility and immunogenetics). Dictation errors have  been sought, but may not have been identified and corrected.    ED Clinical Impression    Diagnosis ICD-10-CM Associated Orders  1. Atrial fibrillation, unspecified type    (CMS-HCC)  I48.91      Documentation assistance was provided by Lavanda Earnest, Scribe on April 10, 2024 at 11:17 PM for Morene Solian, MD.  April 11, 2024 3:57 AM. Documentation assistance provided by the scribe. I was present during the time the encounter was recorded. The information recorded by the scribe was done at my direction and has been reviewed and validated by me.   Note has been documented by Abigail Gillikin on 04/11/2024

## 2024-04-10 NOTE — ED Provider Notes (Signed)
 [facility] Emergency Department Provider Note    ED Clinical Impression     Diagnosis ICD-10-CM Associated Orders  1. Atrial fibrillation, unspecified type    (CMS-HCC)  I48.91         Impression, Medical Decision Making, Progress Notes and Critical Care    Impression, Differential Diagnosis and Plan of Care  Brendan Ray is a 58 y.o. male with a history of RA, palpitations, and hypertension who presents for a few hours of palpitations.   Vital signs are notable for tachycardia 113 BPM. On exam, the patient is in no acute distress. Remarkable for heart sounds present and irregular rhythm and increased rate to 120s.  Lungs are clear to auscultation without any rales, rhonchi, stridor.  No bilateral lower extremity IMA noted.  Remainder of exam unremarkable.  Plan to obtain EKG, CXR, CBC, CMP, PT-INR, and serial troponin. Will treat with IV fluids.  ED Course as of 04/11/24 0052  [place] Apr 10, 2024  4046 This 58 year old male with past medical history of hypertension who presents for palpitations.  He endorses he has had intermittent episodes of A-fib with RVR over the last year which typically self resolve within a few hours.  He has been seen by cardiology with a reassuring echo with estimate ejection fraction of 65%.  2258 EKG interpreted independently which demonstrates an irregularly irregular rhythm consistent with atrial fibrillation with an elevated rate of 125 reason concerns for possible A-fib with RVR.  Left axis deviation noted.  No ST changes that would be concerning for myocardial ischemia.  2259 hsTroponin I: 16  2259 XR Chest 2 views IMPRESSION: No focal opacities. Hazy interstitial prominence which may be due to low lung volumes or mild edema.   2300 Remainder of lab work reassuring as there was no leukocytosis that suggest underlying systemic infectious source or A-fib exacerbation.  Serum chemistry negative for any significant electrolyte  derangements.  2300 As patient's heart rate is staying in the 120s he was agreeable to fluid ministration to try to encourage conversion back into normal sinus rhythm.  Options were also presented to him for rate control with metoprolol.  Patient is agreeable to conservative management fluids at this time.  Will obtain 2-hour Trope reevaluate.  Mon Apr 11, 2024  0052 Patient signed out to oncoming ED resident pending reevaluation after fluid administration.    Additional MDM Elements       I have reviewed recent and relavant previous record, including: Outpatient notes - [facility] Office Visit 02/24/24 for PMH and HPI.       Portions of this record have been created using Scientist, clinical (histocompatibility and immunogenetics). Dictation errors have been sought, but may not have been identified and corrected.  See chart and nursing documentation for additional ED course details.  ____________________________________________      History     Reason for Visit Heart Palpitations   HPI  Brendan Ray is a 58 y.o. male with a history of RA, palpitations, and hypertension (controlled with lisinopril ) who presents for palpitations. The patient reports a few hours of palpitations. He has been travelling all day from an air show in [place] . Because of travel, he has not eaten or drank much today (only consuming a few m&m's and one mountain dew). He drank some water PTA, suspecting he was dehydrated. This initially provided some relief with his HR dropping to 59 BPM, but his palpitations resumed shortly after. Prior to this episode, he has been at baseline healthy  with adequate PO intake over the past few days. He has history of similar of palpitations over the past year for which he has seen cardiology. Previous episodes have lasted a few hours to a few days. Per chart review, Echocardiogram 12/2023 with normal biventricular systolic function, normal LV diastolic function and no significant valvular abnormality. Holter  12/2023 with predominantly sinus rhythm, rare PACs/PVCs, 6 occurrences of short lasting supraventricular tachycardia, longest episode 20 beats without any associated symptoms. Denies chest pain, shortness of breath, lightheadedness, nausea, or dizziness.   Outside Historian(s) Wife at bedside  Past Medical History[1]  Past Surgical History[2]  No current facility-administered medications for this encounter. No current outpatient medications on file.  Allergies Patient has no known allergies.  Family History[3]  Short Social History[4]    Physical Exam   ED Triage Vitals  Enc Vitals Group     BP 04/10/24 2109 142/108     Pulse 04/10/24 2109 113     SpO2 Pulse --      Resp 04/10/24 2106 16     Temp 04/10/24 2106 37 C (98.6 F)     Temp Source 04/10/24 2106 Temporal     SpO2 04/10/24 2106 98 %     Weight 04/10/24 2104 (!) 106.1 kg (234 lb)   Constitutional: Alert and oriented. Well appearing and in no distress. Eyes: Conjunctivae are normal. ENT      Head: Normocephalic and atraumatic.      Nose: No congestion.      Mouth/Throat: Mucous membranes are moist.      Neck: No stridor. Cardiovascular: Increased rate, irregularly irregular rhythm.  Respiratory: Normal respiratory effort. Breath sounds are normal. Gastrointestinal: Soft and nontender. There is no CVA tenderness. Genitourinary: Deferred. Musculoskeletal: Normal range of motion in all extremities.      Right lower leg: No tenderness or edema.      Left lower leg: No tenderness or edema. Neurologic: Normal speech and language. No gross focal neurologic deficits are appreciated. Skin: Skin is warm, dry and intact. No rash noted. Psychiatric: Mood and affect are normal. Speech and behavior are normal.    Radiology   XR Chest 2 views  Final Result  No focal opacities. Hazy interstitial prominence which may be due to low lung volumes or mild edema.             Documentation assistance was provided by  Virgel Pottier, Scribe on April 10, 2024 at 9:35 PM for Rocky Serve, DO.  Documentation assistance provided by the above mentioned scribe. I was present during the time the encounter was recorded. The information recorded by the scribe was done at my direction and has been reviewed and validated by me.         [1] No past medical history on file. [2] No past surgical history on file. [3] No family history on file. [4] Social History Tobacco Use  . Smoking status: Never  . Smokeless tobacco: Never  Vaping Use  . Vaping status: Never Used  Substance Use Topics  . Alcohol use: Never  . Drug use: Never   Serve Rocky HERO, DO Resident 04/11/24 [phone]

## 2024-04-11 DIAGNOSIS — I4891 Unspecified atrial fibrillation: Secondary | ICD-10-CM | POA: Diagnosis not present

## 2024-04-11 DIAGNOSIS — I10 Essential (primary) hypertension: Secondary | ICD-10-CM | POA: Diagnosis not present

## 2024-04-11 DIAGNOSIS — I480 Paroxysmal atrial fibrillation: Secondary | ICD-10-CM | POA: Diagnosis not present

## 2024-04-12 ENCOUNTER — Telehealth: Payer: Self-pay

## 2024-04-12 NOTE — Transitions of Care (Post Inpatient/ED Visit) (Unsigned)
   04/12/2024  Name: Brendan Ray MRN: [id number] DOB: [birth year]  Today's TOC FU Call Status: Today's TOC FU Call Status:: Unsuccessful Call (1st Attempt) Unsuccessful Call (1st Attempt) Date: 04/12/24  Attempted to reach the patient regarding the most recent Inpatient/ED visit.  Follow Up Plan: Additional outreach attempts will be made to reach the patient to complete the Transitions of Care (Post Inpatient/ED visit) call.   Nikki Glanzer Camacho Ocampo, [facility]  [facility] Primary Care & Sports Medicine at [facility] [address] [place], [place] [address] Office: [phone] Fax: [phone]

## 2024-04-14 NOTE — Transitions of Care (Post Inpatient/ED Visit) (Signed)
   04/14/2024  Name: Brendan Ray MRN: [id number] DOB: [birth year]  Today's TOC FU Call Status: Today's TOC FU Call Status:: Unsuccessful Call (1st Attempt) Unsuccessful Call (1st Attempt) Date: 04/12/24 Patient's Name and Date of Birth confirmed.  Transition Care Management Follow-up Telephone Call Date of Discharge: 04/11/24 Discharge Facility: Other [facility]) Name of Other (Non[facility]) Discharge Facility: [facility] How have you been since you were released from the hospital?: Better Any questions or concerns?: No  Items Reviewed: Did you receive and understand the discharge instructions provided?: No Medications obtained,verified, and reconciled?: Yes (Medications Reviewed) Any new allergies since your discharge?: No Dietary orders reviewed?: NA Do you have support at home?: No  Medications Reviewed Today: Medications Reviewed Today     Reviewed by Brendan Ray, CMA (Certified Medical Assistant) on 04/14/24 at 1357  Med List Status: <None>   Medication Order Taking? Sig Documenting Provider Last Dose Status Informant  flecainide (TAMBOCOR) 50 MG tablet [id number] Yes Take 50 mg by mouth 2 (two) times daily. [provider]  Active   fluticasone  (FLONASE ) 50 MCG/ACT nasal spray [id number] Yes Place 2 sprays into both nostrils daily. Brendan Kent, MD  Active   hydroxychloroquine (PLAQUENIL) 200 MG tablet [id number] Yes Take 200 mg by mouth 2 (two) times daily. [provider]  Active   lisinopril -hydrochlorothiazide  (ZESTORETIC ) 10-12.5 MG tablet [id number] Yes TAKE 1 TABLET BY MOUTH EVERY DAY Brendan Jolan NOVAK, PA-C  Active   meloxicam  (MOBIC ) 7.5 MG tablet [id number] Yes Take 7.5 mg by mouth daily. [provider]  Active   metoprolol tartrate (LOPRESSOR) 25 MG tablet [id number] Yes Take 25 mg by mouth 2 (two) times daily. [provider]  Active             Home Care and Equipment/Supplies: Were Home Health  Services Ordered?: NA Any new equipment or medical supplies ordered?: NA  Functional Questionnaire: Do you need assistance with bathing/showering or dressing?: No Do you need assistance with meal preparation?: No Do you need assistance with eating?: No Do you have difficulty maintaining continence: No Do you need assistance with getting out of bed/getting out of a chair/moving?: No Do you have difficulty managing or taking your medications?: No  Follow up appointments reviewed: PCP Follow-up appointment confirmed?: No MD Provider Line [phone] Given: No Specialist Hospital Follow-up appointment confirmed?: Yes Date of Specialist follow-up appointment?: 04/18/24 Follow-Up Specialty Provider:: CARDIOLOGY Do you need transportation to your follow-up appointment?: No Do you understand care options if your condition(s) worsen?: Yes-patient verbalized understanding   Brendan Ray, CMA  [facility] Primary Care & Sports Medicine at [facility] [address] [place], [place] [address] Office: [phone] Fax: [phone]

## 2024-04-18 DIAGNOSIS — I480 Paroxysmal atrial fibrillation: Secondary | ICD-10-CM | POA: Diagnosis not present

## 2024-05-04 DIAGNOSIS — I480 Paroxysmal atrial fibrillation: Secondary | ICD-10-CM | POA: Diagnosis not present

## 2024-05-04 DIAGNOSIS — I10 Essential (primary) hypertension: Secondary | ICD-10-CM | POA: Diagnosis not present

## 2024-05-04 DIAGNOSIS — I471 Supraventricular tachycardia, unspecified: Secondary | ICD-10-CM | POA: Diagnosis not present

## 2024-05-31 DIAGNOSIS — D352 Benign neoplasm of pituitary gland: Secondary | ICD-10-CM | POA: Diagnosis not present

## 2024-05-31 DIAGNOSIS — Z1331 Encounter for screening for depression: Secondary | ICD-10-CM | POA: Diagnosis not present

## 2024-05-31 DIAGNOSIS — H9313 Tinnitus, bilateral: Secondary | ICD-10-CM | POA: Diagnosis not present

## 2024-06-24 ENCOUNTER — Encounter: Payer: Self-pay | Admitting: Family Medicine

## 2024-06-24 ENCOUNTER — Ambulatory Visit: Admitting: Family Medicine

## 2024-06-24 VITALS — BP 112/74 | HR 71 | Ht 68.0 in | Wt 224.0 lb

## 2024-06-24 DIAGNOSIS — I10 Essential (primary) hypertension: Secondary | ICD-10-CM

## 2024-06-24 DIAGNOSIS — H9312 Tinnitus, left ear: Secondary | ICD-10-CM | POA: Insufficient documentation

## 2024-06-24 DIAGNOSIS — Z79899 Other long term (current) drug therapy: Secondary | ICD-10-CM

## 2024-06-24 DIAGNOSIS — Z13220 Encounter for screening for lipoid disorders: Secondary | ICD-10-CM | POA: Diagnosis not present

## 2024-06-24 DIAGNOSIS — Z125 Encounter for screening for malignant neoplasm of prostate: Secondary | ICD-10-CM

## 2024-06-24 DIAGNOSIS — M0609 Rheumatoid arthritis without rheumatoid factor, multiple sites: Secondary | ICD-10-CM | POA: Diagnosis not present

## 2024-06-24 DIAGNOSIS — I471 Supraventricular tachycardia, unspecified: Secondary | ICD-10-CM | POA: Insufficient documentation

## 2024-06-24 DIAGNOSIS — I480 Paroxysmal atrial fibrillation: Secondary | ICD-10-CM | POA: Insufficient documentation

## 2024-06-24 DIAGNOSIS — Z136 Encounter for screening for cardiovascular disorders: Secondary | ICD-10-CM | POA: Diagnosis not present

## 2024-06-24 DIAGNOSIS — H9313 Tinnitus, bilateral: Secondary | ICD-10-CM

## 2024-06-24 DIAGNOSIS — D352 Benign neoplasm of pituitary gland: Secondary | ICD-10-CM | POA: Insufficient documentation

## 2024-06-24 MED ORDER — LISINOPRIL-HYDROCHLOROTHIAZIDE 10-12.5 MG PO TABS: ORAL_TABLET | ORAL | 1 refills | Status: AC

## 2024-06-24 NOTE — Progress Notes (Signed)
 Established Patient Office Visit  Patient ID: [name], male    DOB: 1965/12/06  Age: 58 y.o. MRN: [id number] PCP: Briona Korpela K, MD  No chief complaint on file.   Subjective:     HPI  Discussed the use of AI scribe software for clinical note transcription with the patient, who gave verbal consent to proceed.  History of Present Illness  Brendan Ray is a 58 year old male with HTN, SVT, atrial fibrillation and rheumatoid arthritis who presents for TOC and follow-up on his heart condition and rheumatoid arthritis management.  He has a history of atrial fibrillation and recently visited the emergency room where he was found to be in AFib. At that time, he was not on medication but has since started taking flecainide and Lopressor, which have effectively controlled his heart rate, now at 60 beats per minute at rest. He has not experienced any AFib episodes since starting these medications. He is not currently taking Eliquis due to concerns about medication adherence and potential side effects. He has a history of high blood pressure but no history of stroke, TIA, or heart failure. No shortness of breath or chest pain.  He is experiencing a mild flare-up of rheumatoid arthritis, which began approximately three weeks ago. The symptoms started in his hands and have moved to his throat and chest, with pain affecting different areas, including his jaw and ribs. He has not been on rheumatoid arthritis medication for about two years due to a previous adverse reaction to Humira, which he associates with a severe COVID-19 infection. He is currently taking Plaquenil but has not been on methotrexate due to intolerance.  He denies any family history of strokes, although his grandparents had strokes in their 90s. He does not drink alcohol and takes Advil as needed for joint pain. He reports feeling fatigued and tired during the day, which he attributes to his rheumatoid arthritis. No symptoms of  sleep apnea such as loud snoring or observed apneas during sleep. His high blood pressure is managed with medication. He does not consume more than eight alcoholic drinks per week.      Signed,  Ladoris MARLA Ny, MD    06/24/2024 9:12 AM       Review of Systems  All other systems reviewed and are negative.     Objective:     There were no vitals taken for this visit. BP Readings from Last 3 Encounters:  06/24/24 112/74  03/04/24 128/80  12/24/23 124/78   Wt Readings from Last 3 Encounters:  06/24/24 224 lb (101.6 kg)  03/04/24 232 lb (105.2 kg)  12/24/23 224 lb 8 oz (101.8 kg)      Physical Exam Vitals and nursing note reviewed.  Constitutional:      Appearance: Normal appearance.  HENT:     Head: Normocephalic.     Right Ear: External ear normal.     Left Ear: External ear normal.  Eyes:     Conjunctiva/sclera: Conjunctivae normal.  Cardiovascular:     Rate and Rhythm: Normal rate.  Pulmonary:     Effort: Pulmonary effort is normal. No respiratory distress.  Abdominal:     Palpations: Abdomen is soft.  Musculoskeletal:        General: Normal range of motion.  Skin:    General: Skin is warm.  Neurological:     Mental Status: He is alert and oriented to person, place, and time.  Psychiatric:  Mood and Affect: Mood normal.     Physical Exam     No results found for any visits on 06/24/24.     The 10-year ASCVD risk score (Arnett DK, et al., 2019) is: 8.2%* (Cholesterol units were assumed)    Assessment & Plan:   Problem List Items Addressed This Visit   None   Assessment and Plan Assessment & Plan Paroxysmal atrial fibrillation Well-controlled with flecainide and Lopressor. CHADVASc score 1, bleeding risk score 0. Discussed stroke and bleeding risks, benefits of Eliquis. - Start Eliquis for anticoagulation. - Notify cardiologist about starting Eliquis. - Avoid NSAIDs while on Eliquis. - Monitor for signs of bleeding and seek  emergency care if he occurs. - Schedule follow-up with cardiology in six months.  Rheumatoid arthritis, multiple sites Mild flare-up in hands, neck, and throat. On Plaquenil. Previous intolerance to methotrexate and Humira. Discussed steroid pack. - Consult rheumatologist regarding current flare-up and potential steroid pack. - Consider holistic approaches such as turmeric for joint health. - Discuss weight loss options with rheumatologist, considering potential risks and benefits. Possibly Insurance might cover it if pt is on certain biologic medications.  Essential hypertension Well-controlled with current medication regimen. - Continue current antihypertensive medication regimen.  Suspected obstructive sleep apnea High risk due to age, BMI, and neck circumference. Discussed importance of sleep study. - Ordered home sleep study after January 1st. Pt will send mychart text to schedule sleep study. - Coordinate with nurse for scheduling sleep study.    No follow-ups on file.    Brendan K Leva Baine, MD [facility] Health Primary Care & Sports Medicine at [facility]

## 2024-06-25 LAB — PSA: Prostate Specific Ag, Serum: 0.4 ng/mL (ref 0.0–4.0)

## 2024-06-25 LAB — CBC WITH DIFFERENTIAL/PLATELET
Basophils Absolute: 0.1 x10E3/uL (ref 0.0–0.2)
Basos: 1 %
EOS (ABSOLUTE): 0.4 x10E3/uL (ref 0.0–0.4)
Eos: 5 %
Hematocrit: 50.3 % (ref 37.5–51.0)
Hemoglobin: 17 g/dL (ref 13.0–17.7)
Immature Grans (Abs): 0 x10E3/uL (ref 0.0–0.1)
Immature Granulocytes: 0 %
Lymphocytes Absolute: 2.7 x10E3/uL (ref 0.7–3.1)
Lymphs: 34 %
MCH: 31.9 pg (ref 26.6–33.0)
MCHC: 33.8 g/dL (ref 31.5–35.7)
MCV: 94 fL (ref 79–97)
Monocytes Absolute: 0.8 x10E3/uL (ref 0.1–0.9)
Monocytes: 11 %
Neutrophils Absolute: 3.9 x10E3/uL (ref 1.4–7.0)
Neutrophils: 49 %
Platelets: 219 x10E3/uL (ref 150–450)
RBC: 5.33 x10E6/uL (ref 4.14–5.80)
RDW: 12.5 % (ref 11.6–15.4)
WBC: 7.9 x10E3/uL (ref 3.4–10.8)

## 2024-06-25 LAB — COMPREHENSIVE METABOLIC PANEL WITH GFR
ALT: 26 IU/L (ref 0–44)
AST: 18 IU/L (ref 0–40)
Albumin: 3.9 g/dL (ref 3.8–4.9)
Alkaline Phosphatase: 87 IU/L (ref 47–123)
BUN/Creatinine Ratio: 12 (ref 9–20)
BUN: 13 mg/dL (ref 6–24)
Bilirubin Total: 0.7 mg/dL (ref 0.0–1.2)
CO2: 23 mmol/L (ref 20–29)
Calcium: 9.2 mg/dL (ref 8.7–10.2)
Chloride: 103 mmol/L (ref 96–106)
Creatinine, Ser: 1.13 mg/dL (ref 0.76–1.27)
Globulin, Total: 2.9 g/dL (ref 1.5–4.5)
Glucose: 99 mg/dL (ref 70–99)
Potassium: 4.1 mmol/L (ref 3.5–5.2)
Sodium: 141 mmol/L (ref 134–144)
Total Protein: 6.8 g/dL (ref 6.0–8.5)
eGFR: 75 mL/min/1.73 (ref >59)

## 2024-06-25 LAB — LIPID PANEL
Chol/HDL Ratio: 4.4 ratio (ref 0.0–5.0)
Cholesterol, Total: 175 mg/dL (ref 100–199)
HDL: 40 mg/dL (ref >39)
LDL Chol Calc (NIH): 116 mg/dL — ABNORMAL HIGH (ref 0–99)
Triglycerides: 106 mg/dL (ref 0–149)
VLDL Cholesterol Cal: 19 mg/dL (ref 5–40)

## 2024-06-27 ENCOUNTER — Ambulatory Visit: Payer: Self-pay | Admitting: Family Medicine

## 2024-08-25 ENCOUNTER — Ambulatory Visit: Admitting: Family Medicine
